# Patient Record
Sex: Male | Born: 1935 | Race: White | Hispanic: No | Smoking: Former smoker
Health system: Southern US, Community
[De-identification: ages and names within clinical notes are randomized; demographics above are authoritative.]

## PROBLEM LIST (undated history)

## (undated) DIAGNOSIS — H3530 Unspecified macular degeneration: Secondary | ICD-10-CM

## (undated) DIAGNOSIS — J189 Pneumonia, unspecified organism: Secondary | ICD-10-CM

## (undated) DIAGNOSIS — I499 Cardiac arrhythmia, unspecified: Secondary | ICD-10-CM

## (undated) DIAGNOSIS — I509 Heart failure, unspecified: Secondary | ICD-10-CM

## (undated) DIAGNOSIS — F329 Major depressive disorder, single episode, unspecified: Secondary | ICD-10-CM

## (undated) DIAGNOSIS — M25473 Effusion, unspecified ankle: Secondary | ICD-10-CM

## (undated) DIAGNOSIS — Z8739 Personal history of other diseases of the musculoskeletal system and connective tissue: Secondary | ICD-10-CM

## (undated) DIAGNOSIS — J449 Chronic obstructive pulmonary disease, unspecified: Secondary | ICD-10-CM

## (undated) DIAGNOSIS — F32A Depression, unspecified: Secondary | ICD-10-CM

## (undated) DIAGNOSIS — I5032 Chronic diastolic (congestive) heart failure: Secondary | ICD-10-CM

## (undated) DIAGNOSIS — I4891 Unspecified atrial fibrillation: Secondary | ICD-10-CM

## (undated) HISTORY — DX: Chronic diastolic (congestive) heart failure: I50.32

## (undated) HISTORY — DX: Personal history of other diseases of the musculoskeletal system and connective tissue: Z87.39

## (undated) HISTORY — DX: Chronic obstructive pulmonary disease, unspecified: J44.9

## (undated) HISTORY — DX: Unspecified macular degeneration: H35.30

---

## 2000-08-05 ENCOUNTER — Encounter: Payer: Self-pay | Admitting: Specialist

## 2000-08-05 ENCOUNTER — Encounter: Admission: RE | Admit: 2000-08-05 | Discharge: 2000-08-05 | Payer: Self-pay | Admitting: Specialist

## 2001-10-09 ENCOUNTER — Ambulatory Visit (HOSPITAL_COMMUNITY): Admission: RE | Admit: 2001-10-09 | Discharge: 2001-10-09 | Payer: Self-pay | Admitting: *Deleted

## 2007-08-12 ENCOUNTER — Ambulatory Visit (HOSPITAL_COMMUNITY): Admission: RE | Admit: 2007-08-12 | Discharge: 2007-08-12 | Payer: Self-pay | Admitting: Internal Medicine

## 2011-01-16 HISTORY — PX: DOPPLER ECHOCARDIOGRAPHY: SHX263

## 2011-02-02 NOTE — Procedures (Signed)
[facility]  Patient:    Jeremiah Mcmahon, Jeremiah Mcmahon Visit Number: [id number] MRN: [id number]          Service Type: END Location: ENDO Attending Physician:  Sabino Gasser Dictated by:   Sabino Gasser, M.D. Proc. Date: 10/09/01 Admit Date:  10/09/2001                             Procedure Report  PROCEDURE:  Colonoscopy.  INDICATION:  Colon cancer screening.  ANESTHESIA:  Demerol 50, Versed 3 mg.  DESCRIPTION OF PROCEDURE:  With the patient mildly sedated in the left lateral decubitus position, the Olympus videoscopic colonoscope was inserted into the rectum after a normal rectal exam and passed under direct vision to the cecum, identified by the ileocecal valve and appendiceal orifice, both of which were photographed.  From this point, the colonoscope was slowly withdrawn, taking circumferential views of the entire colonic mucosa, stopping only in the rectum which appeared normal on direct and showed hemorrhoids on retroflexed view.  The endoscope was straightened and withdrawn.  The patients vital signs and pulse oximeter remained stable.  The patient tolerated the procedure well without apparent complications.  FINDINGS:  Hemorrhoids, otherwise unremarkable examination.  PLAN:  Have patient follow up with me as needed. Dictated by:   Sabino Gasser, M.D. Attending Physician:  Sabino Gasser DD:  10/09/01 TD:  10/09/01 Job: [id number]

## 2012-07-03 ENCOUNTER — Encounter (HOSPITAL_BASED_OUTPATIENT_CLINIC_OR_DEPARTMENT_OTHER): Payer: Self-pay | Admitting: Emergency Medicine

## 2012-07-03 ENCOUNTER — Emergency Department (HOSPITAL_BASED_OUTPATIENT_CLINIC_OR_DEPARTMENT_OTHER): Payer: Medicare Other

## 2012-07-03 ENCOUNTER — Emergency Department (HOSPITAL_BASED_OUTPATIENT_CLINIC_OR_DEPARTMENT_OTHER)
Admission: EM | Admit: 2012-07-03 | Discharge: 2012-07-03 | Disposition: A | Discharge status: Home / Self Care | Payer: Medicare Other | Attending: Emergency Medicine | Admitting: Emergency Medicine

## 2012-07-03 DIAGNOSIS — Y998 Other external cause status: Secondary | ICD-10-CM | POA: Insufficient documentation

## 2012-07-03 DIAGNOSIS — Y9389 Activity, other specified: Secondary | ICD-10-CM | POA: Insufficient documentation

## 2012-07-03 DIAGNOSIS — S20219A Contusion of unspecified front wall of thorax, initial encounter: Secondary | ICD-10-CM | POA: Insufficient documentation

## 2012-07-03 DIAGNOSIS — Z87891 Personal history of nicotine dependence: Secondary | ICD-10-CM | POA: Insufficient documentation

## 2012-07-03 DIAGNOSIS — W19XXXA Unspecified fall, initial encounter: Secondary | ICD-10-CM | POA: Insufficient documentation

## 2012-07-03 DIAGNOSIS — I4891 Unspecified atrial fibrillation: Secondary | ICD-10-CM | POA: Insufficient documentation

## 2012-07-03 HISTORY — DX: Unspecified atrial fibrillation: I48.91

## 2012-07-03 HISTORY — DX: Effusion, unspecified ankle: M25.473

## 2012-07-03 MED ORDER — HYDROCODONE-ACETAMINOPHEN 5-500 MG PO TABS: 1.0000 | ORAL_TABLET | Freq: Four times a day (QID) | ORAL | Status: DC | PRN

## 2012-07-03 MED ORDER — HYDROCODONE-ACETAMINOPHEN 5-325 MG PO TABS
1.0000 | ORAL_TABLET | Freq: Once | ORAL | Status: AC
  Administered 2012-07-03: 1 via ORAL
  Filled 2012-07-03: qty 1

## 2012-07-03 NOTE — ED Notes (Signed)
Pt sts he fell in his room 2 nights ago.  Since then is having some pain in his right low rib area on his back.  Difficult to find a tender area but pain with certain movements.

## 2012-07-03 NOTE — ED Provider Notes (Signed)
History     CSN: [id number]  Arrival date & time 07/03/12  1551   First MD Initiated Contact with Patient 07/03/12 1755      Chief Complaint  Patient presents with  . Fall  . Back Pain    (Consider location/radiation/quality/duration/timing/severity/associated sxs/prior treatment) HPI Comments: Pt complains of right rib pain after a fall 2 days ago.  Fell backward when he was going to sit in a chair and missed chair, hit right mid back on chair.  Has had constant pain to right posterior rib.  Worse with movement, coughing, deep breathing.  No SOB.  No abd pain.   No dizziness.  No neck or back pain.  Did not hit his head when he fell.  No LOC.  The history is provided by the patient.    Past Medical History  Diagnosis Date  . Atrial fibrillation   . Swelling of ankle     History reviewed. No pertinent past surgical history.  No family history on file.  History  Substance Use Topics  . Smoking status: Former Games developer  . Smokeless tobacco: Not on file  . Alcohol Use: Yes      Review of Systems  Constitutional: Negative for fever, chills, diaphoresis and fatigue.  HENT: Negative for congestion, rhinorrhea, sneezing and neck pain.   Eyes: Negative.   Respiratory: Negative for cough, chest tightness and shortness of breath.   Cardiovascular: Positive for chest pain (rib pain). Negative for leg swelling.  Gastrointestinal: Negative for nausea, vomiting, abdominal pain, diarrhea and blood in stool.  Genitourinary: Negative for frequency, hematuria, flank pain and difficulty urinating.  Musculoskeletal: Negative for back pain and arthralgias.  Skin: Negative for rash.  Neurological: Negative for dizziness, speech difficulty, weakness, numbness and headaches.    Allergies  Review of patient's allergies indicates no known allergies.  Home Medications   Current Outpatient Rx  Name Route Sig Dispense Refill  . HYDROCODONE-ACETAMINOPHEN 5-500 MG PO TABS Oral Take 1-2  tablets by mouth every 6 (six) hours as needed for pain. 15 tablet 0    BP 138/84  Pulse 100  Temp 97.9 F (36.6 C) (Oral)  Resp 20  Ht 5\' 9"  (1.753 m)  Wt 182 lb (82.555 kg)  BMI 26.88 kg/m2  SpO2 100%  Physical Exam  Constitutional: He is oriented to person, place, and time. He appears well-developed and well-nourished.  HENT:  Head: Normocephalic and atraumatic.  Eyes: Pupils are equal, round, and reactive to light.  Neck: Normal range of motion. Neck supple.       No pain over neck or back  Cardiovascular: Normal rate, regular rhythm and normal heart sounds.   Pulmonary/Chest: Effort normal and breath sounds normal. No respiratory distress. He has no wheezes. He has no rales. He exhibits tenderness.       Ecchymosis to right mid back with TTP right lower rib. No crepitus or deformity  Abdominal: Soft. Bowel sounds are normal. There is no tenderness. There is no rebound and no guarding.       No pain around liver  Musculoskeletal: Normal range of motion. He exhibits no edema.       No pain with palpation or ROM of extremities  Lymphadenopathy:    He has no cervical adenopathy.  Neurological: He is alert and oriented to person, place, and time.  Skin: Skin is warm and dry. No rash noted.  Psychiatric: He has a normal mood and affect.    ED Course  Procedures (including  critical care time)  No results found for this or any previous visit. Dg Chest 2 View  07/03/2012  *RADIOLOGY REPORT*  Clinical Data: Right-sided chest pain.  CHEST - 2 VIEW  Comparison: None  Findings: The heart is mildly enlarged.  The mediastinal and hilar contours are normal.  The lungs are clear of acute process.  A small calcified granuloma is noted in the left lower lobe.  No pneumothorax or pleural effusion.  The bony thorax is intact.  No definite acute rib fractures.  IMPRESSION: No acute cardiopulmonary findings and intact bony thorax.   Original Report Authenticated By: P. Loralie Champagne, M.D.       1. Rib contusion       MDM  Pt well appearing, no SOB, sats normal.  No abd pain.  No evidence of head injury.  Will tx with vicodin, given incentive spirometer, will f/u with PMD if not improving.        Rolan Bucco, MD 07/03/12 [phone]

## 2012-07-03 NOTE — Patient Instructions (Signed)
Pt instructed on the proper use of administering IS x 10 Q/hr. Pt achieved without difficulty and no pain. Pt tolerated well

## 2013-03-25 ENCOUNTER — Ambulatory Visit (INDEPENDENT_AMBULATORY_CARE_PROVIDER_SITE_OTHER): Payer: Medicare Other | Admitting: Cardiology

## 2013-03-25 ENCOUNTER — Encounter: Payer: Self-pay | Admitting: Cardiology

## 2013-03-25 VITALS — BP 118/82 | HR 85 | Ht 70.0 in | Wt 194.7 lb

## 2013-03-25 DIAGNOSIS — I4821 Permanent atrial fibrillation: Secondary | ICD-10-CM | POA: Insufficient documentation

## 2013-03-25 DIAGNOSIS — M25473 Effusion, unspecified ankle: Secondary | ICD-10-CM

## 2013-03-25 DIAGNOSIS — R600 Localized edema: Secondary | ICD-10-CM | POA: Insufficient documentation

## 2013-03-25 DIAGNOSIS — I482 Chronic atrial fibrillation, unspecified: Secondary | ICD-10-CM

## 2013-03-25 DIAGNOSIS — I4891 Unspecified atrial fibrillation: Secondary | ICD-10-CM

## 2013-03-25 NOTE — Progress Notes (Signed)
Patient ID: Jeremiah Mcmahon, male   DOB: 10-25-1913, 79 y.o.   MRN: [id number]  Clinic Note: HPI: Jeremiah Mcmahon is a 79 y.o. male with a essentially rate controlled and asymptomatic atrial fibrillation who presents today for routine annual followup.  Interval History: Elderly these do quite well. His reactive for his age. He denies any sensation of palpitations or rapid heartbeats. He denies any chest tightness or pressure with exertion. No PND, orthopnea, mild as mild lower extremity edema. He says he takes his furosemide on an as-needed basis but otherwise the swelling is stable. His worse on the foot this foot drop. He still goes to the gym and is living and in this swimming. He says of late he's had some increased bowel movements but denies any melena, hematochezia hematuria. No other GI symptoms. No lightheadedness, dizziness, wooziness or syncope/near syncope. No TIA or RCA symptoms. No melena, hematochezia, or hematuria. No claudication symptoms.  Past Medical History  Diagnosis Date  . Atrial fibrillation     rate controlled; asymptomatic  . Swelling of ankle   . Macular degeneration   . H/O spinal stenosis     R foot drop  . COPD, mild     Prior Cardiac Evaluation and Past Surgical History: Past Surgical History  Procedure Laterality Date  . Doppler echocardiography  May 2012    Normal EF greater than 55%. Moderate left and right atria dilation; Mild MR; mild aortic sclerosis without stenosis     No Known Allergies  Current Outpatient Prescriptions  Medication Sig Dispense Refill  . Ascorbic Acid (VITAMIN C) 1000 MG tablet Take 1,000 mg by mouth daily.      Marland Kitchen aspirin 325 MG EC tablet Takes a 1/4 a tablet by mouth daily      . clopidogrel (PLAVIX) 75 MG tablet Take 75 mg by mouth daily.      . Cyanocobalamin (VITAMIN B 12 PO) Take 1,000 mcg by mouth.      . diltiazem (CARDIZEM) 120 MG tablet Take 120 mg by mouth 2 (two) times daily.      . fish oil-omega-3 fatty acids 1000  MG capsule Take 1 g by mouth daily.      . furosemide (LASIX) 40 MG tablet Take 40 mg by mouth daily. In the morning      . Glucosamine 500 MG CAPS Take 1,000 mg by mouth.      Marland Kitchen HYDROcodone-acetaminophen (VICODIN) 5-500 MG per tablet Take 1-2 tablets by mouth every 6 (six) hours as needed for pain.  15 tablet  0  . Multiple Vitamins-Minerals (MULTI COMPLETE PO) Take by mouth.      . potassium chloride (KLOR-CON) 8 MEQ tablet Take 8 mEq by mouth daily.       No current facility-administered medications for this visit.    Social History Narrative   Lives in an [facility] retirement community.  He is a widowed father of 4, grandfather of 1. He works out at [employer] 60 minutes at a time 2 to 3 times a week doing arm and leg exercises. He does not walk well because of his foot drop. He does not smoke and has an occasional alcoholic beverage.  He no longer drives.   He is looking into getting into swimming this fall.    ROS: A comprehensive Review of Systems - Negative except pertinent positives above Gastrointestinal ROS: positive for - change in bowel habits, change in stools and Increase, loose stool, but not incontinent or diarrhea.  PHYSICAL EXAM BP 118/82  Pulse 85  Ht 5\' 10"  (1.778 m)  Wt 194 lb 11.2 oz (88.315 kg)  BMI 27.94 kg/m2 General appearance: alert, cooperative, appears stated age, no distress and Otherwise healthy-appearing. Normal mood and affect. Well-nourished well-groomed. Answers questions appropriately. Neck: no adenopathy, no carotid bruit, no JVD, supple, symmetrical, trachea midline and thyroid not enlarged, symmetric, no tenderness/mass/nodules Lungs: clear to auscultation bilaterally, normal percussion bilaterally and Nonlabored, good air movement. Heart: regular rate and rhythm, S1, S2 normal, no S3 or S4, systolic murmur: systolic ejection 1/6, crescendo, decrescendo and Very soft at 2nd right intercostal space, no click and no rub Abdomen: soft,  non-tender; bowel sounds normal; no masses,  no organomegaly Extremities: extremities normal, atraumatic, no cyanosis or edema and no ulcers, gangrene or trophic changes Pulses: 2+ and symmetric Neurologic: Grossly normal  [id number] today: Yes Rate: 85 , Rhythm: Atrial fibrillation; bifascicular block (R BBB, LAFB) Recent Labs:  ASSESSMENT: Stable elderly gentleman with no symptoms of his atrial fibrillation. No angina or other cardiac symptoms to speak of.  I've instructed him to look out for the following:  Keep up the Exercise & Let us Know If Anything Changes:  Bleeding (in Stool, Urine, or Anywhere)  Dizzines, Lightheadedness or Passing out Symptoms  Shortness of Breath on Exertion, or Lying down.   Chronic atrial fibrillation Rates well controlled on calcitriol blocker and otherwise astigmatic. He is on aspirin Plavix for stroke prevention. Not sure how much benefit he is driving from that. If there was any question or concern about any bleeding or bruising I would simply to stop the Plavix. A  Swelling of ankle This is pretty stable, worse on the right than left with foot drop. As the bothering him he is using Lasix, when necessary basis if worse.   PLAN: Per problem list. Orders Placed This Encounter  Procedures  . EKG 12-Lead    Followup: 1 yr.  DAVID W. Herbie Baltimore, M.D., M.S. [facility] [address] [place], [place]  [address]  [phone] Pager # [phone]

## 2013-03-25 NOTE — Patient Instructions (Addendum)
You continue to amaze me how well you are doing. The atrial fibrillation does not seem to be bothering you.  I do not plan to make any changes to your medicines.    Keep up the Exercise & Let us Know If Anything Changes:  Bleeding (in Stool, Urine, or Anywhere)  Dizzines, Lightheadedness or Passing out Symptoms  Shortness of Breath on Exertion, or Lying down.  Marykay Lex, MD

## 2013-04-03 ENCOUNTER — Encounter: Payer: Self-pay | Admitting: Cardiology

## 2013-04-03 NOTE — Assessment & Plan Note (Signed)
Rates well controlled on calcitriol blocker and otherwise astigmatic. He is on aspirin Plavix for stroke prevention. Not sure how much benefit he is driving from that. If there was any question or concern about any bleeding or bruising I would simply to stop the Plavix. A

## 2013-04-03 NOTE — Assessment & Plan Note (Signed)
This is pretty stable, worse on the right than left with foot drop. As the bothering him he is using Lasix, when necessary basis if worse.

## 2013-05-06 ENCOUNTER — Encounter: Payer: Self-pay | Admitting: Cardiology

## 2013-05-15 ENCOUNTER — Other Ambulatory Visit: Payer: Self-pay | Admitting: Cardiology

## 2013-05-15 NOTE — Telephone Encounter (Signed)
Rx was sent to pharmacy electronically. 

## 2013-07-13 ENCOUNTER — Other Ambulatory Visit: Payer: Self-pay | Admitting: *Deleted

## 2013-07-13 MED ORDER — DILTIAZEM HCL 120 MG PO TABS: 120.0000 mg | ORAL_TABLET | Freq: Two times a day (BID) | ORAL | Status: DC

## 2013-07-13 NOTE — Telephone Encounter (Signed)
Rx was sent to pharmacy electronically. 

## 2013-09-30 ENCOUNTER — Emergency Department (HOSPITAL_BASED_OUTPATIENT_CLINIC_OR_DEPARTMENT_OTHER)
Admission: EM | Admit: 2013-09-30 | Discharge: 2013-09-30 | Disposition: A | Discharge status: Home / Self Care | Payer: Medicare Other | Attending: Emergency Medicine | Admitting: Emergency Medicine

## 2013-09-30 ENCOUNTER — Encounter (HOSPITAL_BASED_OUTPATIENT_CLINIC_OR_DEPARTMENT_OTHER): Payer: Self-pay | Admitting: Emergency Medicine

## 2013-09-30 DIAGNOSIS — R040 Epistaxis: Secondary | ICD-10-CM | POA: Insufficient documentation

## 2013-09-30 DIAGNOSIS — Z7982 Long term (current) use of aspirin: Secondary | ICD-10-CM | POA: Insufficient documentation

## 2013-09-30 DIAGNOSIS — I4891 Unspecified atrial fibrillation: Secondary | ICD-10-CM | POA: Insufficient documentation

## 2013-09-30 DIAGNOSIS — Z87891 Personal history of nicotine dependence: Secondary | ICD-10-CM | POA: Insufficient documentation

## 2013-09-30 DIAGNOSIS — J449 Chronic obstructive pulmonary disease, unspecified: Secondary | ICD-10-CM | POA: Insufficient documentation

## 2013-09-30 DIAGNOSIS — Z79899 Other long term (current) drug therapy: Secondary | ICD-10-CM | POA: Insufficient documentation

## 2013-09-30 DIAGNOSIS — Z7902 Long term (current) use of antithrombotics/antiplatelets: Secondary | ICD-10-CM | POA: Insufficient documentation

## 2013-09-30 DIAGNOSIS — Z8669 Personal history of other diseases of the nervous system and sense organs: Secondary | ICD-10-CM | POA: Insufficient documentation

## 2013-09-30 DIAGNOSIS — Z8739 Personal history of other diseases of the musculoskeletal system and connective tissue: Secondary | ICD-10-CM | POA: Insufficient documentation

## 2013-09-30 DIAGNOSIS — J4489 Other specified chronic obstructive pulmonary disease: Secondary | ICD-10-CM | POA: Insufficient documentation

## 2013-09-30 MED ORDER — SILVER NITRATE-POT NITRATE 75-25 % EX MISC
CUTANEOUS | Status: AC
  Administered 2013-09-30: 12:00:00
  Filled 2013-09-30: qty 1

## 2013-09-30 MED ORDER — OXYMETAZOLINE HCL 0.05 % NA SOLN
NASAL | Status: AC
  Administered 2013-09-30: 12:00:00
  Filled 2013-09-30: qty 15

## 2013-09-30 NOTE — Discharge Instructions (Signed)

## 2013-09-30 NOTE — ED Notes (Signed)
MD at bedside. 

## 2013-09-30 NOTE — ED Notes (Signed)
No bleeding noted from left nare.

## 2013-09-30 NOTE — ED Notes (Signed)
ENT cart at bedside

## 2013-09-30 NOTE — ED Provider Notes (Signed)
CSN: [id number]     Arrival date & time 09/30/13  1127 History   First MD Initiated Contact with Patient 09/30/13 1138     Chief Complaint  Patient presents with  . Epistaxis   (Consider location/radiation/quality/duration/timing/severity/associated sxs/prior Treatment) Patient is a 80 y.o. male presenting with nosebleeds. The history is provided by the patient.  Epistaxis Location:  L nare Severity:  Moderate Timing:  Intermittent Progression:  Worsening Chronicity:  New Context: aspirin use and weather change (cold, heat on)   Context: not bleeding disorder, not CPAP, not drug use and not elevation change   Relieved by:  Nothing Worsened by:  Nothing tried Ineffective treatments:  Nasal tampon Associated symptoms: no blood in oropharynx, no congestion, no cough, no dizziness, no fever, no sore throat and no syncope     Past Medical History  Diagnosis Date  . Atrial fibrillation     rate controlled; asymptomatic  . Swelling of ankle   . Macular degeneration   . H/O spinal stenosis     R foot drop  . COPD, mild    Past Surgical History  Procedure Laterality Date  . Doppler echocardiography  May 2012    Normal EF greater than 55%. Moderate left and right atria dilation; Mild MR; mild aortic sclerosis without stenosis    Family History  Problem Relation Age of Onset  . Healthy      Non-contributory due to age   History  Substance Use Topics  . Smoking status: Former Smoker    Types: Pipe  . Smokeless tobacco: Not on file  . Alcohol Use: 0.6 oz/week    1 Glasses of wine per week    Review of Systems  Constitutional: Negative for fever.  HENT: Positive for nosebleeds. Negative for congestion and sore throat.   Respiratory: Negative for cough.   Cardiovascular: Negative for syncope.  Neurological: Negative for dizziness.  All other systems reviewed and are negative.    Allergies  Review of patient's allergies indicates no known allergies.  Home Medications    Current Outpatient Rx  Name  Route  Sig  Dispense  Refill  . Ascorbic Acid (VITAMIN C) 1000 MG tablet   Oral   Take 1,000 mg by mouth daily.         Marland Kitchen. aspirin 325 MG EC tablet      Takes a 1/4 a tablet by mouth daily         . clopidogrel (PLAVIX) 75 MG tablet      TAKE ONE TABLET BY MOUTH EVERY DAY   30 tablet   11   . Cyanocobalamin (VITAMIN B 12 PO)   Oral   Take 1,000 mcg by mouth.         . diltiazem (CARDIZEM) 120 MG tablet   Oral   Take 1 tablet (120 mg total) by mouth 2 (two) times daily.   60 tablet   9   . fish oil-omega-3 fatty acids 1000 MG capsule   Oral   Take 1 g by mouth daily.         . furosemide (LASIX) 40 MG tablet   Oral   Take 40 mg by mouth daily. In the morning         . Glucosamine 500 MG CAPS   Oral   Take 1,000 mg by mouth.         Marland Kitchen. HYDROcodone-acetaminophen (VICODIN) 5-500 MG per tablet   Oral   Take 1-2 tablets by mouth every 6 (  six) hours as needed for pain.   15 tablet   0   . Multiple Vitamins-Minerals (MULTI COMPLETE PO)   Oral   Take by mouth.         . potassium chloride (KLOR-CON) 8 MEQ tablet   Oral   Take 8 mEq by mouth daily.          BP 129/89  Pulse 85  Temp(Src) 97.9 F (36.6 C) (Oral)  Resp 16  Ht 5\' 10"  (1.778 m)  Wt 190 lb (86.183 kg)  BMI 27.26 kg/m2  SpO2 94% Physical Exam  Nursing note and vitals reviewed. Constitutional: He is oriented to person, place, and time. He appears well-developed and well-nourished. No distress.  HENT:  Head: Normocephalic and atraumatic.  Nose: Epistaxis (L nare, anterior) is observed.  No foreign bodies.  Mouth/Throat: No oropharyngeal exudate.  Eyes: EOM are normal. Pupils are equal, round, and reactive to light.  Neck: Normal range of motion. Neck supple.  Cardiovascular: Normal rate and regular rhythm.  Exam reveals no friction rub.   No murmur heard. Pulmonary/Chest: Effort normal and breath sounds normal. No respiratory distress. He has no  wheezes. He has no rales.  Abdominal: He exhibits no distension. There is no tenderness. There is no rebound.  Musculoskeletal: Normal range of motion. He exhibits no edema.  Neurological: He is alert and oriented to person, place, and time.  Skin: No rash noted. He is not diaphoretic.    ED Course  EPISTAXIS MANAGEMENT Date/Time: 09/30/2013 11:59 AM Performed by: Dagmar Hait Authorized by: Dagmar Hait Consent: Verbal consent obtained. Patient sedated: no Treatment site: left anterior Repair method: silver nitrate Treatment complexity: simple   (including critical care time) Labs Review Labs Reviewed - No data to display Imaging Review No results found.  EKG Interpretation   None       MDM   1. Anterior epistaxis    57M presents with epistaxis. Seen at his PCP's office, sent here for further eval. Anterior pack placed by PCP using glove and cotton balls. On plavix and aspirin. No hx of epistaxis. Had this happen last night, self-limited. Began again this am. Here AFVSS. L nare packing removed. After Afrin administration, anterior bleeding noted. Silver nitrate stick used for cauterization. Will recheck. Second cauterization performed after recheck as he was still bleeding. Patient stable after that without bleeding for 45 minutes. Stable for discharge.    Dagmar Hait, MD 09/30/13 [phone]

## 2013-09-30 NOTE — ED Notes (Signed)
Nosebleed that started at 0830 this am.  He reports nosebleed last pm but it stopped.

## 2013-10-20 ENCOUNTER — Ambulatory Visit (INDEPENDENT_AMBULATORY_CARE_PROVIDER_SITE_OTHER): Payer: Medicare Other | Admitting: Cardiology

## 2013-10-20 ENCOUNTER — Encounter: Payer: Self-pay | Admitting: Cardiology

## 2013-10-20 VITALS — BP 124/96 | HR 96 | Ht 68.0 in | Wt 192.1 lb

## 2013-10-20 DIAGNOSIS — R609 Edema, unspecified: Secondary | ICD-10-CM

## 2013-10-20 DIAGNOSIS — R0989 Other specified symptoms and signs involving the circulatory and respiratory systems: Secondary | ICD-10-CM

## 2013-10-20 DIAGNOSIS — E039 Hypothyroidism, unspecified: Secondary | ICD-10-CM

## 2013-10-20 DIAGNOSIS — R0600 Dyspnea, unspecified: Secondary | ICD-10-CM | POA: Insufficient documentation

## 2013-10-20 DIAGNOSIS — I4891 Unspecified atrial fibrillation: Secondary | ICD-10-CM

## 2013-10-20 DIAGNOSIS — J449 Chronic obstructive pulmonary disease, unspecified: Secondary | ICD-10-CM

## 2013-10-20 DIAGNOSIS — R0609 Other forms of dyspnea: Secondary | ICD-10-CM

## 2013-10-20 DIAGNOSIS — Z8709 Personal history of other diseases of the respiratory system: Secondary | ICD-10-CM

## 2013-10-20 DIAGNOSIS — I4821 Permanent atrial fibrillation: Secondary | ICD-10-CM

## 2013-10-20 DIAGNOSIS — R600 Localized edema: Secondary | ICD-10-CM

## 2013-10-20 DIAGNOSIS — I452 Bifascicular block: Secondary | ICD-10-CM

## 2013-10-20 DIAGNOSIS — Z87898 Personal history of other specified conditions: Secondary | ICD-10-CM

## 2013-10-20 NOTE — Assessment & Plan Note (Signed)
"  Mild" by previous PFTs

## 2013-10-20 NOTE — Patient Instructions (Addendum)
Keep your follow up with Dr Renne CriglerPharr, go to the ER if your symptoms worsen.  Your physician recommends that you schedule a follow-up appointment in: 3 months with Dr Herbie BaltimoreHarding

## 2013-10-20 NOTE — Assessment & Plan Note (Signed)
Pt referred for gradually worsening dyspnea on exertion

## 2013-10-20 NOTE — Progress Notes (Addendum)
10/20/2013 Jeremiah ObeyElmer Mcmahon   12-15-1913  [id number]  Primary Physicia Londell MohPHARR,WALTER DAVIDSON, MD Primary Cardiologist: Dr Herbie BaltimoreHarding  HPI:  80 y/o male followed by Dr Herbie BaltimoreHarding and Dr Renne CriglerPharr. He lives at an assisted living facility. He is sharp mentally and has tried to stay active. He has CAF and is on ASA and Plavix. An echo in May 2012 showed mild LVH and an EF of 55%. He has chronic lower extremity edema and has had some degree of dyspnea for some time. His daughter thinks he had "mild COPD" on a prior PFT. He is in the office today with complaints of gradually increasing dyspnea with exertion. He denies orthopnea, he actually says he feels better when he lays flat. His main complain is SOB when walking. He says he can now hardly get down the hallway at his assited living facility.  Dr Renne CriglerPharr had seen him two weeks ago. The pt says his LE edema is a little better but his DOE has not changed.    Current Outpatient Prescriptions  Medication Sig Dispense Refill  . Ascorbic Acid (VITAMIN C) 1000 MG tablet Take 1,000 mg by mouth daily.      Marland Kitchen. aspirin 325 MG EC tablet Takes a 1/4 a tablet by mouth daily      . clopidogrel (PLAVIX) 75 MG tablet TAKE ONE TABLET BY MOUTH EVERY DAY  30 tablet  11  . Cyanocobalamin (VITAMIN B 12 PO) Take 1,000 mcg by mouth.      . diltiazem (CARDIZEM) 120 MG tablet Take 1 tablet (120 mg total) by mouth 2 (two) times daily.  60 tablet  9  . fish oil-omega-3 fatty acids 1000 MG capsule Take 1 g by mouth daily.      . furosemide (LASIX) 40 MG tablet Take 40 mg by mouth 2 (two) times daily. In the morning      . Glucosamine 500 MG CAPS Take 1,000 mg by mouth.      . levothyroxine (SYNTHROID, LEVOTHROID) 50 MCG tablet Take 50 mcg by mouth daily before breakfast.      . Multiple Vitamins-Minerals (MULTI COMPLETE PO) Take by mouth.      . potassium chloride (KLOR-CON) 8 MEQ tablet Take 8 mEq by mouth daily.       No current facility-administered medications for this visit.    No  Known Allergies  History   Social History  . Marital Status: Divorced    Spouse Name: N/A    Number of Children: N/A  . Years of Education: N/A   Occupational History  . Not on file.   Social History Main Topics  . Smoking status: Former Smoker    Types: Pipe  . Smokeless tobacco: Not on file  . Alcohol Use: 0.6 oz/week    1 Glasses of wine per week  . Drug Use: No  . Sexual Activity: Not on file   Other Topics Concern  . Not on file   Social History Narrative   Lives in an Independent Living retirement community.  He is a widowed father of 4, grandfather of 1. He works out at [employer] 60 minutes at a time 2 to 3 times a week doing arm and leg exercises. He does not walk well because of his foot drop. He does not smoke and has an occasional alcoholic beverage.  He no longer drives.   He is looking into getting into swimming this fall.     Review of Systems: General: negative for chills, fever, night  sweats or weight changes.  Cardiovascular: negative for chest pain, orthopnea, palpitations, paroxysmal nocturnal dyspnea Dermatological: negative for rash Respiratory: negative for cough or wheezing Urologic: negative for hematuria Abdominal: negative for nausea, vomiting, diarrhea, bright red blood per rectum, melena, or hematemesis Neurologic: negative for visual changes, syncope, or dizziness All other systems reviewed and are otherwise negative except as noted above.    Blood pressure 124/96, pulse 96, height 5\' 8"  (1.727 m), weight 192 lb 1.6 oz (87.136 kg).  General appearance: alert, cooperative and no distress Neck: no carotid bruit and no JVD Lungs: expiratory wheezing bilat Heart: regular rate and rhythm Abdomen: soft non tender Extremities: chronic venous changes, chronic 1-2+ edema Skin: cool and dry Neurologic: Grossly normal  EKG AF, RBBB, LAFB  ASSESSMENT AND PLAN:   Dyspnea Pt referred for gradually worsening dyspnea on exertion  Permanent atrial  fibrillation .  RBBB with left anterior fascicular block .  Edema of both legs .  H/O epistaxis One episode requiring an ER visit, I suggested he continue Plavix.  COPD (chronic obstructive pulmonary disease) "Mild" by previous PFTs   PLAN  His O2 sat on RA is 93%. He has chronic LE edema that is actually better than usual. His dyspnea is exertional. He has no orthopnea. I'm not convinced this is CHF. It may be primarily pulmonary. I suggested to the pt and his daughter that he go to the ER for lab work, CXR, and treatment based on those results. They declined and will follow up with Dr Renne Crigler this afternoon. He knows to go to the hospital if his symptoms change for the worse.  Corine Shelter KPA-C 10/20/2013 1:51 PM   Jeremiah Mcmahon asked about Plavix. He apparently had to go to the ER for a nosebleed in Jan. Since then he has used a humidifier and Vaseline and has not had recurrent epistaxis. I suggested he continue Plavix and low dose aspirin for now with CAF.

## 2013-10-20 NOTE — Assessment & Plan Note (Signed)
One episode requiring an ER visit, I suggested he continue Plavix.

## 2014-01-29 DIAGNOSIS — I5032 Chronic diastolic (congestive) heart failure: Secondary | ICD-10-CM | POA: Insufficient documentation

## 2014-02-03 ENCOUNTER — Ambulatory Visit: Payer: Medicare Other | Admitting: Cardiology

## 2014-03-10 ENCOUNTER — Ambulatory Visit: Payer: Medicare Other | Admitting: Cardiology

## 2014-03-22 ENCOUNTER — Telehealth: Payer: Self-pay | Admitting: Cardiology

## 2014-03-22 NOTE — Telephone Encounter (Signed)
Wants to talk to a nurse regarding his appointment tomorrow with laura.  Wants to know if he should wait til next week to see Dr Herbie BaltimoreHarding instead of coming both weeks.  Please call

## 2014-03-22 NOTE — Telephone Encounter (Signed)
Daughter wants to know does he really need to see the PA tomorrow and the doctor next week?  Really just wants to see the doctor.  Was seen by Dr. Lowell GuitarPowell last week for increasing SOB at the rehab facility where he is staying.  CXR showed increased fluid build up in his lungs and was started on Torsemide. Per Dr. Lowell GuitarPowell -  If this does not work will need to draw the fluid off.  Told daughter it is entirely up to she and her father whether they see Nada BoozerLaura Ingold, NP tomorrow, depending on his symptoms, or wait until next week and see Dr. Herbie BaltimoreHarding.  I did suggest they call Dr. Lowell GuitarPowell for follow-up.  Voiced understanding and will talk with her father and let us know in AM if he is coming tomorrow.

## 2014-03-23 ENCOUNTER — Ambulatory Visit: Payer: Medicare Other | Admitting: Cardiology

## 2014-03-29 ENCOUNTER — Encounter: Payer: Self-pay | Admitting: Cardiology

## 2014-03-29 ENCOUNTER — Ambulatory Visit (INDEPENDENT_AMBULATORY_CARE_PROVIDER_SITE_OTHER): Payer: Medicare Other | Admitting: Cardiology

## 2014-03-29 VITALS — BP 100/70 | HR 104 | Wt 203.0 lb

## 2014-03-29 DIAGNOSIS — Z87898 Personal history of other specified conditions: Secondary | ICD-10-CM

## 2014-03-29 DIAGNOSIS — T50904A Poisoning by unspecified drugs, medicaments and biological substances, undetermined, initial encounter: Secondary | ICD-10-CM

## 2014-03-29 DIAGNOSIS — I9589 Other hypotension: Secondary | ICD-10-CM

## 2014-03-29 DIAGNOSIS — R0609 Other forms of dyspnea: Secondary | ICD-10-CM

## 2014-03-29 DIAGNOSIS — I4891 Unspecified atrial fibrillation: Secondary | ICD-10-CM

## 2014-03-29 DIAGNOSIS — R609 Edema, unspecified: Secondary | ICD-10-CM

## 2014-03-29 DIAGNOSIS — I509 Heart failure, unspecified: Secondary | ICD-10-CM

## 2014-03-29 DIAGNOSIS — R0600 Dyspnea, unspecified: Secondary | ICD-10-CM

## 2014-03-29 DIAGNOSIS — Z8709 Personal history of other diseases of the respiratory system: Secondary | ICD-10-CM

## 2014-03-29 DIAGNOSIS — R600 Localized edema: Secondary | ICD-10-CM

## 2014-03-29 DIAGNOSIS — R0989 Other specified symptoms and signs involving the circulatory and respiratory systems: Secondary | ICD-10-CM

## 2014-03-29 DIAGNOSIS — I4821 Permanent atrial fibrillation: Secondary | ICD-10-CM

## 2014-03-29 DIAGNOSIS — I952 Hypotension due to drugs: Secondary | ICD-10-CM

## 2014-03-29 MED ORDER — DILTIAZEM HCL 60 MG PO TABS: 60.0000 mg | ORAL_TABLET | Freq: Three times a day (TID) | ORAL | Status: DC

## 2014-03-29 MED ORDER — TORSEMIDE 20 MG PO TABS: ORAL_TABLET | ORAL | Status: DC

## 2014-03-29 NOTE — Patient Instructions (Addendum)
Torsemide 40 mg twice a day for 2 days then  40 mg in morning and 20 mg in evening for 2 days then For the next 6 days alternate  40 mg in am and 20 mg in evening every other( even ) day then 20 mg twice a day  Every other (odd) day. Then 20 mg twice a a day.  DECREASE CARVEDIOL  T0 3.125 MG  TWICE A DAY FOR 2 DAYS THEN STOP- WITH  DECREASE START DILTIAZEM 60 MG TWICE A DAY   FOR 2 DAYS  AFTER DILTIAZEM 60 MG THREE TIMES  DAY.     Your physician wants you to follow-up in  SEPT 10, 2015 AT 11:30 AM DR Herbie BaltimoreHarding. 30 min appt  You will receive a reminder letter in the mail two months in advance. If you don't receive a letter, please call our office to schedule the follow-up appointment.

## 2014-03-31 DIAGNOSIS — I952 Hypotension due to drugs: Secondary | ICD-10-CM | POA: Insufficient documentation

## 2014-03-31 NOTE — Assessment & Plan Note (Addendum)
Again, without knowledge of the EF, I don't know that I'm convinced that his symptoms are totally left-sided CHF.  He does have an elevated BNP, but I don't know what her baseline was. However he does have significant dyspnea with oxygen requirement which would suggest if nothing else elevated pulmonary pressures related to lower extremity edema. He really does not have much in the way of orthopnea or PND to suggest left heart failure. When we do know is that he is extremely volume overloaded with anasarca, and his weight is essentially 10 pounds from his previous dry weight of 192 pounds in February. (194 in July of last year)  He doesn't have a lot of blood pressure will, and I think with his hypotension, carvedilol was not a good option for him. It is not control his heart rate which is not helping his effective cardiac output. My plan is to convert from carvedilol back to diltiazem.  We'll need to get his records from [facility] in order to fully know what to do. I am also not privy to recent chest x-ray findings.  The only way to get rid of fluid is to diuresis. He is on torsemide at 20 mg twice a day. Plan: Increase torsemide to 40 mg twice a day for 2 days, then 40 mg a.m., 20 mg p.m. for 2 days and then alternate every other day doing 20 twice a day one day and 40 mg in a.m. and 20 in p.m. the next day. He will continue this for one week and then return back to 20 twice a day. He will need labs rechecked which will probably be done by his PCP or nephrologist. We will probably need to except some baseline renal insufficiency.  I am also concerned with his hypotension and not convinced that carvedilol is the best treatment for him. Converting back to diltiazem unless there is some suggestion of decreased EF.   I do know is to be seeing his nephrologist next month so I will see him back in 2 months.

## 2014-03-31 NOTE — Assessment & Plan Note (Signed)
His blood pressure this morning was way too low, it is still low in the clinic. Carvedilol may be good for her heart failure, but I would not continue with his responsiveness to the alpha component.  If we need to use a beta blocker in the setting of reduced EF, recommend metoprolol succinate or bisoprolol, but for now unless I know for sure his EF is reduced I will use diltiazem which has been better for rate control and less hypotensive inducing

## 2014-03-31 NOTE — Assessment & Plan Note (Signed)
He does have baseline edema and has orders for her leg wraps. He did not have baseline anasarca, therefore I think we need to diurese off at least 10 more pounds. Therefore not able to do this with by mouth medications it may require short rehospitalization for IV diuresis.

## 2014-03-31 NOTE — Progress Notes (Signed)
Patient ID: Jeremiah Mcmahon, male   DOB: 1914/06/28, 80 y.o.   MRN: [id number] PCP:  Jeremiah Mcmahon  Clinic Note:  Chief Complaint:  Chief Complaint  Patient presents with  . Follow-up    POST HOSP FOR 13 DAYS - CHF, UTI,- NO CHEST PAIN , O2 THERAPY CONTINOUS MOST  OF THE TIME, EDEMA -fluid restrictions    HPI: Jeremiah Mcmahon is a 80 y.o. male with a essentially rate controlled and previously asymptomatic atrial fibrillation, and chronic lower extremity edema with no other signs and symptoms of heart failure in the past who presents today post-hospitalization followup. He did see Jeremiah Mcmahon, [place] and February for progressively worsening exertional dyspnea, but improved edema. He was noted to have an oxygen saturation level of 92% on room air. Justifiably, Jeremiah Mcmahon was not convinced that this was CHF, but recommended that the patient go to the ER for more detailed assessment with labs and chest x-ray. They declined and went to see Dr. Renne Mcmahon.  Apparently, he was just hospitalized at [facility] for her what appear to be anasarca, acute kidney injury with symptoms concerning for CHF. I do not have the discharge summary or the echocardiogram findings.  Care Everywhere Does not have results from his hospitalization. I do know that he was switched from diltiazem to carvedilol, and his nephrologist has reduced the dose of carvedilol to 6.25 mg twice a day from 12.5 mg twice a day. He is on furosemide plus Aldactone despite renal insufficiency.  He apparently was diuresis to 20 pounds, but remains over 10 pounds above his dry weight from last year. He is currently at [facility] in the rehabilitation center. He is on home oxygen now at 2 L per nasal cannula.  He was admitted on June 14 with worsening shortness of breath and increasing swelling of the lower extremities. He was found to be in A. fib with RVR. His very attentive daughter Jeremiah Mcmahon(Jeremiah Mcmahon) actually went to see him at  the assisted living center and was concerned about his dyspnea and worsening edema. He at no time indicated any sensation of chest discomfort or sensation of rapid heartbeat. No fevers or chills to suggest any active infection. The only symptom is worse the worsening exertional dyspnea with PND and orthopnea. From what I can tell, his initial care included placing him on a diltiazem drip as well as Lopressor which was eventually changed to carvedilol because of possible heart failure. He had a chest x-ray which showed pleural effusion.  Interval History:  Since his discharge, he has seen his nephrologist who noted hypotension and dizziness. His beta blocker dose was reduced. He was put on a low-salt diet and given a fluid restriction of 1500 mL. A BNP level was checked on July 6 which showed a number of 508. I don't know what the hospital reading was. His creatinine was 1.81 (up from 1.65 on June 29) with a BUN of 37 (this was suggest prerenal). Overall, his edema is better but he still has swelling up into his thighs. His baseline edema is pretty much 2+ to his knees. He doesn't notice much of the way of any PND orthopnea, but still has a hard time getting around because his edema. He is on oxygen still and notes exertional dyspnea. He does not sense any of his rapid A. fib. He did note feeling lightheaded and dizzy this morning and has felt that way several mornings but has not had any falls.  No chest tightness or pressure with rest or exertion. No syncope/near syncope or TIA/CVA symptoms. Just overall general fatigue with prolonged episodes of sleeping. No melena, hematochezia, hematuria or epistaxis. No claudication, but does have a sense of heavy dull discomfort in his legs. He is currently trying to rehabilitation but is nowhere near back to the state that he was last year when he was going to the gym and doing exercises and swimming.  Past Medical History  Diagnosis Date  . Atrial fibrillation       rate controlled; asymptomatic  . Swelling of ankle   . Macular degeneration   . H/O spinal stenosis     R foot drop  . COPD, mild     recent admission for "heart failure "  Acute on chronic renal insufficiency - current creatinine 1.8  Prior Cardiac Evaluation and Past Surgical History: Past Surgical History  Procedure Laterality Date  . Doppler echocardiography  May 2012    Normal EF greater than 55%. Moderate left and right atria dilation; Mild MR; mild aortic sclerosis without stenosis    recent echocardiogram not available  No Known Allergies  Current Outpatient Prescriptions  Medication Sig Dispense Refill  . carvedilol (COREG) 6.25 MG tablet Take 6.25 mg by mouth 2 (two) times daily with a meal.      . clopidogrel (PLAVIX) 75 MG tablet TAKE ONE TABLET BY MOUTH EVERY DAY  30 tablet  11  . Cyanocobalamin (VITAMIN B 12 PO) Take 1,000 mcg by mouth.      . doxycycline (VIBRAMYCIN) 100 MG capsule Take 100 mg by mouth 2 (two) times daily. FOR 7 DAYS      . levothyroxine (SYNTHROID, LEVOTHROID) 75 MCG tablet Take 75 mcg by mouth daily before breakfast.      . OXYGEN-HELIUM IN Inhale into the lungs. 2 LITERS      . spironolactone (ALDACTONE) 25 MG tablet Take 12.5 mg by mouth daily.      Marland Kitchen torsemide (DEMADEX) 20 MG tablet Take 20 mg twice  a  Day , after followed instructons  60 tablet  11  . diltiazem (CARDIZEM) 60 MG tablet Take 1 tablet (60 mg total) by mouth 3 (three) times daily.  90 tablet  11   No current facility-administered medications for this visit.    Social History Narrative   L[facility] in an [facility].  He is a widowed father of 4, grandfather of 1. He works out at [employer] 60 minutes at a time 2 to 3 times a week doing arm and leg exercises. He does not walk well because of his foot drop. He does not smoke and has an occasional alcoholic beverage.  He no longer drives.   He is currently in rehabilitation following his hospitalization      ROS: A comprehensive Review of Systems - notable for excessive daytime sleepiness, abdominal bloating and fatigue. Otherwise negative.  PHYSICAL EXAM BP 100/70  Pulse 104  Wt 203 lb (92.08 kg) General appearance: He is in no acute distress Alert and responsive, but closes eyes and is less rapid and would answer questions than before. He is cooperative, appears stated age, Normal mood and affect. Well-nourished well-groomed. Answers questions appropriately. Neck: no adenopathy, no carotid bruit, mild JVD with HJR, supple Lungs: clear to auscultation bilaterally, normal percussion bilaterally -- with this exception of mild bibasal breath sounds being reduced but no dullness to percussion. and Nonlabored, good air movement - on 2 L of oxygen by  nasal cannula Heart:  rapid, irregularly irregular rate/rhythm, S1, S2 normal, no S3 or S4, systolic murmur: systolic ejection 1/6, crescendo, decrescendo and Very soft at 2nd right intercostal space, no click and no rub Abdomen: soft, non-tender; bowel sounds normal; no masses,  no organomegaly Extremities: No clubbing or cyanosis. He has 3+ pitting edema to the knees at least 2 if not 3+ pitting edema up to the mid thighs consistent with anasarca. Mild presacral edema. There is also puffy edema noted in both hands and arms. Pulses: 2+ and symmetric Neurologic: Grossly normal; closes his eyes a lot during the evaluation.  NWG:NFAOZHYQM today: Yes Rate: 104 , Rhythm: Atrial fibrillation with RVR; bifascicular block (R BBB, LAFB) -- with the exception of the increased heart rate, no significant change from last EKG Recent Labs:   03/22/2014: Sodium 143, potassium 3.8, chloride 98, bicarbonate 39, glucose 87, BUN 37/creatinine 1.1. Calcium 7.8. BNP 508.6  03/15/2014: Sodium 148, potassium 4.0, chloride 98, bicarbonate 43, glucose 95, BUN/creatinine 46/1.65. Notable total protein 5.3, albumin 2.9.; Vitamin B12 greater than 2000. TSH 9.34 with T4-5 0.7  (normal suggestive of sick euthyroid)  ASSESSMENT: Severity difficult situation because I do not know what was done during the hospitalization and do not know what the echocardiogram suggested his EF was.  The only thing I know from his last echo was that his LV function was normal a systolic standpoint but there was diastolic dysfunction. No way to know if his episode was related to new systolic dysfunction or simply just acute on chronic diastolic dysfunction. It could also simply just be from worsening renal function with volume overload. He is clearly hypotensive today, and his blood pressure this morning was in the 80s. He felt very dizzy lightheaded.   CHF with unknown LVEF Again, without knowledge of the EF, I don't know that I'm convinced that his symptoms are totally left-sided CHF.  He does have an elevated BNP, but I don't know what her baseline was. However he does have significant dyspnea with oxygen requirement which would suggest if nothing else elevated pulmonary pressures related to lower extremity edema. He really does not have much in the way of orthopnea or PND to suggest left heart failure. When we do know is that he is extremely volume overloaded with anasarca, and his weight is essentially 10 pounds from his previous dry weight of 192 pounds in February. (194 in July of last year)  He doesn't have a lot of blood pressure will, and I think with his hypotension, carvedilol was not a good option for him. It is not control his heart rate which is not helping his effective cardiac output. My plan is to convert from carvedilol back to diltiazem.  We'll need to get his records from [facility] in order to fully know what to do. I am also not privy to recent chest x-ray findings.  The only way to get rid of fluid is to diuresis. He is on torsemide at 20 mg twice a day. Plan: Increase torsemide to 40 mg twice a day for 2 days, then 40 mg a.m., 20 mg p.m. for 2 days and then alternate every  other day doing 20 twice a day one day and 40 mg in a.m. and 20 in p.m. the next day. He will continue this for one week and then return back to 20 twice a day. He will need labs rechecked which will probably be done by his PCP or nephrologist. We will probably need to  except some baseline renal insufficiency.  I am also concerned with his hypotension and not convinced that carvedilol is the best treatment for him. Converting back to diltiazem unless there is some suggestion of decreased EF.   I do know is to be seeing his nephrologist next month so I will see him back in 2 months.  Permanent atrial fibrillation Plan is to wean off carvedilol -- reduced carvedilol to 3.125 mg twice a day with the addition of 60 mg twice a day diltiazem (was previously on 120 twice a day) x2 days. Would then stop carvedilol and increase diltiazem to 60 mg 3 times a day.  Hopefully this will improve heart rate control and reduce hypotension.  Edema of both legs He does have baseline edema and has orders for her leg wraps. He did not have baseline anasarca, therefore I think we need to diurese off at least 10 more pounds. Therefore not able to do this with by mouth medications it may require short rehospitalization for IV diuresis.  Dyspnea He is actually on oxygen which is new for him. He has COPD listed as a possible diagnosis, but also probably had some elevated pulmonary venous pressures. Obtaining his echocardiogram his vital. I do not perceive doing any invasive procedures to evaluate his pulmonary pressures, but an echocardiogram would really help. For now continue home oxygen but hopefully if we were able to aggressive diuresis this will improve.  H/O epistaxis If he has recurrent bleeding, I would recommend holding Plavix for 7 days. This is therefore possible stroke prevention with his A. fib. The relative benefit of Plavix is not or if increased risk if there is bleeding.  Hypotension due to drugs His  blood pressure this morning was way too low, it is still low in the clinic. Carvedilol may be good for her heart failure, but I would not continue with his responsiveness to the alpha component.  If we need to use a beta blocker in the setting of reduced EF, recommend metoprolol succinate or bisoprolol, but for now unless I know for sure his EF is reduced I will use diltiazem which has been better for rate control and less hypotensive inducing   PLAN: CHF with unknown LVEF Again, without knowledge of the EF, I don't know that I'm convinced that his symptoms are totally left-sided CHF.  He does have an elevated BNP, but I don't know what her baseline was. However he does have significant dyspnea with oxygen requirement which would suggest if nothing else elevated pulmonary pressures related to lower extremity edema. He really does not have much in the way of orthopnea or PND to suggest left heart failure. When we do know is that he is extremely volume overloaded with anasarca, and his weight is essentially 10 pounds from his previous dry weight of 192 pounds in February. (194 in July of last year)  He doesn't have a lot of blood pressure will, and I think with his hypotension, carvedilol was not a good option for him. It is not control his heart rate which is not helping his effective cardiac output. My plan is to convert from carvedilol back to diltiazem.  We'll need to get his records from [facility] in order to fully know what to do. I am also not privy to recent chest x-ray findings.  The only way to get rid of fluid is to diuresis. He is on torsemide at 20 mg twice a day. Plan: Increase torsemide to 40 mg twice a  day for 2 days, then 40 mg a.m., 20 mg p.m. for 2 days and then alternate every other day doing 20 twice a day one day and 40 mg in a.m. and 20 in p.m. the next day. He will continue this for one week and then return back to 20 twice a day. He will need labs rechecked which will probably be  done by his PCP or nephrologist. We will probably need to except some baseline renal insufficiency.  I am also concerned with his hypotension and not convinced that carvedilol is the best treatment for him. Converting back to diltiazem unless there is some suggestion of decreased EF.   I do know is to be seeing his nephrologist next month so I will see him back in 2 months.  Permanent atrial fibrillation Plan is to wean off carvedilol -- reduced carvedilol to 3.125 mg twice a day with the addition of 60 mg twice a day diltiazem (was previously on 120 twice a day) x2 days. Would then stop carvedilol and increase diltiazem to 60 mg 3 times a day.  Hopefully this will improve heart rate control and reduce hypotension.  Edema of both legs He does have baseline edema and has orders for her leg wraps. He did not have baseline anasarca, therefore I think we need to diurese off at least 10 more pounds. Therefore not able to do this with by mouth medications it may require short rehospitalization for IV diuresis.  Dyspnea He is actually on oxygen which is new for him. He has COPD listed as a possible diagnosis, but also probably had some elevated pulmonary venous pressures. Obtaining his echocardiogram his vital. I do not perceive doing any invasive procedures to evaluate his pulmonary pressures, but an echocardiogram would really help. For now continue home oxygen but hopefully if we were able to aggressive diuresis this will improve.  H/O epistaxis If he has recurrent bleeding, I would recommend holding Plavix for 7 days. This is therefore possible stroke prevention with his A. fib. The relative benefit of Plavix is not or if increased risk if there is bleeding.  Hypotension due to drugs His blood pressure this morning was way too low, it is still low in the clinic. Carvedilol may be good for her heart failure, but I would not continue with his responsiveness to the alpha component.  If we need to  use a beta blocker in the setting of reduced EF, recommend metoprolol succinate or bisoprolol, but for now unless I know for sure his EF is reduced I will use diltiazem which has been better for rate control and less hypotensive inducing   This was a difficult encounter with intubation data to fully evaluate the patient. There was a significant amount of important information not available in this notably changed and worsened patient. The overall encounter took close to 45 minutes with an additional 15 minutes spent on chart review.  Orders Placed This Encounter  Procedures  . EKG 12-Lead    Followup: 1 yr.  Aubery Date W. Herbie Baltimore, M.D., M.S. [facility] [address] [place], [place]  [address]  [phone] Pager # [phone]

## 2014-03-31 NOTE — Assessment & Plan Note (Addendum)
Plan is to wean off carvedilol -- reduced carvedilol to 3.125 mg twice a day with the addition of 60 mg twice a day diltiazem (was previously on 120 twice a day) x2 days. Would then stop carvedilol and increase diltiazem to 60 mg 3 times a day.  Hopefully this will improve heart rate control and reduce hypotension.

## 2014-03-31 NOTE — Assessment & Plan Note (Signed)
If he has recurrent bleeding, I would recommend holding Plavix for 7 days. This is therefore possible stroke prevention with his A. fib. The relative benefit of Plavix is not or if increased risk if there is bleeding.

## 2014-03-31 NOTE — Assessment & Plan Note (Signed)
He is actually on oxygen which is new for him. He has COPD listed as a possible diagnosis, but also probably had some elevated pulmonary venous pressures. Obtaining his echocardiogram his vital. I do not perceive doing any invasive procedures to evaluate his pulmonary pressures, but an echocardiogram would really help. For now continue home oxygen but hopefully if we were able to aggressive diuresis this will improve.

## 2014-04-14 ENCOUNTER — Telehealth: Payer: Self-pay | Admitting: Cardiology

## 2014-04-14 NOTE — Telephone Encounter (Signed)
Please call concerning his medication and medicare is discontinuing the care for his physical therapy.

## 2014-04-14 NOTE — Telephone Encounter (Signed)
Joan MayansSally Kenney (daughter) Annice PihCalled [employer] and they have palced the case on hold until they received information from us and that we have until 5pm tomorrow to fax the information about Mr.Scarantino    Thanks

## 2014-04-14 NOTE — Telephone Encounter (Signed)
Spoke with sally, pts dtr, medicare is going to stop his coverage for his PT and skilled nursing on 04-15-14. The dtr has appealed but they need a letter from us sent to the company to keep him covered. The dtr reports the reason they are denying the coverage is because they feel the pt needs to be in a skilled facility instead of rehab. The dtr reports he has made significant improvements since his med changes when he was seen two weeks ago. The name of the company is [employer], their fax number is [phone] The case ID# [id number] EB Made aware dr harding is not in the office today, will discuss with sharon his nurse to make sure they have received the paperwork regarding this issue. They would like something done asap.

## 2014-04-15 NOTE — Telephone Encounter (Signed)
I'm happy to help out with this, but I think the PCP can also help.  Marykay LexHARDING,DAVID W, MD

## 2014-04-15 NOTE — Telephone Encounter (Signed)
SPOKE WITH DAUGHTER.She states that this company is working with [employer]. Informed daughter , not sure if any information from Dr Herbie BaltimoreHarding will be helpful . Will send last office notes. SHE STATES UNDERSTANDING.  She also had another question- Father has some incontinent issue  She wanted to know if this is permanent. RN informed daughter, Dr Herbie BaltimoreHarding would not know if that is a temporary or permanent. RN advised-PCP or UROLOGIST  Daughter states patient improvement since visit. Edema is reduced. Daughter also wanted to know where to take him if he needs to go to hospital RN advised if [facility] -she can  let  MD- WHO HIS CARDIOLOGIST possible transfer She verbalized understanding

## 2014-05-10 ENCOUNTER — Telehealth: Payer: Self-pay | Admitting: *Deleted

## 2014-05-10 NOTE — Telephone Encounter (Signed)
Returned signed -reviewed order concerning Plavix Per Dr Herbie Baltimore - it was a PCP D[facility]

## 2014-05-27 ENCOUNTER — Encounter: Payer: Self-pay | Admitting: Cardiology

## 2014-05-27 ENCOUNTER — Ambulatory Visit (INDEPENDENT_AMBULATORY_CARE_PROVIDER_SITE_OTHER): Payer: Medicare Other | Admitting: Cardiology

## 2014-05-27 VITALS — BP 113/72 | HR 87 | Ht 72.0 in | Wt 179.8 lb

## 2014-05-27 DIAGNOSIS — I5032 Chronic diastolic (congestive) heart failure: Secondary | ICD-10-CM

## 2014-05-27 DIAGNOSIS — I4821 Permanent atrial fibrillation: Secondary | ICD-10-CM

## 2014-05-27 DIAGNOSIS — R609 Edema, unspecified: Secondary | ICD-10-CM

## 2014-05-27 DIAGNOSIS — I4891 Unspecified atrial fibrillation: Secondary | ICD-10-CM

## 2014-05-27 DIAGNOSIS — T50904A Poisoning by unspecified drugs, medicaments and biological substances, undetermined, initial encounter: Secondary | ICD-10-CM

## 2014-05-27 DIAGNOSIS — I952 Hypotension due to drugs: Secondary | ICD-10-CM

## 2014-05-27 DIAGNOSIS — R600 Localized edema: Secondary | ICD-10-CM

## 2014-05-27 DIAGNOSIS — I9589 Other hypotension: Secondary | ICD-10-CM

## 2014-05-27 DIAGNOSIS — I452 Bifascicular block: Secondary | ICD-10-CM

## 2014-05-27 MED ORDER — TORSEMIDE 20 MG PO TABS: ORAL_TABLET | ORAL | Status: DC

## 2014-05-27 NOTE — Patient Instructions (Addendum)
SLIDING SCALE FOR WEIGHT GAIN-----dry weight 179 lbs IF weight is above 3-5 lbs dry weight Take 40 mg in morning and 20 mg in evening until reach dry weight. If weight is >5 is follow the above and call the office.  DEMADEX 20 MG  on Monday Wednesday,Friday  Twice a day  All other days take 20 mg one daily    Your physician wants you to follow-up in 2 MONTH Dr Herbie Baltimore. 30 min appointment.  You will receive a reminder letter in the mail two months in advance. If you don't receive a letter, please call our office to schedule the follow-up appointment.

## 2014-05-30 ENCOUNTER — Encounter: Payer: Self-pay | Admitting: Cardiology

## 2014-05-30 NOTE — Assessment & Plan Note (Signed)
SLIDING SCALE FOR WEIGHT GAIN-----dry weight 179 lbs IF weight is above 3-5 lbs dry weight Take 40 mg in morning and 20 mg in evening until reach dry weight. If weight is >5 is follow the above and call the office.  DEMADEX 20 MG  on Monday Wednesday,Friday  Twice a day  All other days take 20 mg one daily  Continue spironolactone. With diastolic failure we will use diltiazem which he tolerated better than carvedilol.

## 2014-05-30 NOTE — Progress Notes (Signed)
PCP: Londell Moh, MD  Clinic Note: Chief Complaint  Patient presents with  . 3 month visit    HPI: Jeremiah Mcmahon is a 80 y.o. male with a PMH below who presents today for  Two-month followup.. I last saw him in July, he was recovering from recent hospital stay where he was extremely volume overloaded. He ended up developing acute diastolic heart failure with anasarca and acute renal injury. The notes indicate that initially he overloaded at that was back to normal. The Notes from hospitalization kept mentioning systolic heart failure but for the most part it was diastolic heart failure. His medications were adjusted where he was taken off of the low dose of diltiazem and put on carvedilol. He became profoundly hypotensive and dizzy with near falls. He is also now switched him to a low dose of diltiazem again since his EF was back to normal. He continues to be on torsemide and spironolactone. Currently taking torsemide 20 twice a day. He is no longer on home oxygen.  Past Medical History  Diagnosis Date  . Atrial fibrillation     rate controlled; asymptomatic  . Swelling of ankle   . Macular degeneration   . H/O spinal stenosis     R foot drop  . COPD, mild   . Chronic diastolic heart failure, NYHA class 2     With recent exacerbation - exacerbated by A. fib RVR    Prior Cardiac Evaluation and Past Surgical History: Past Surgical History  Procedure Laterality Date  . Doppler echocardiography  May 2012    Normal EF greater than 55%. Moderate left and right atria dilation; Mild MR; mild aortic sclerosis without stenosis    most recent echo from 2015 showed preserved EF approximately 50-55%.(Currently do not have report))  Interval History: Since I last saw him, he is hoped to be moving out of the skilled nursing facility portion of his retirement community and into the assisted living portion. He is become notably improved his current regimen. We he dropped a lot of weight  with the adjustments made during his last visit where we really wrapped up his diuretic for several days. He is now down about 24 pounds from his last visit and is feeling much better his edema has doubly improved as has his dyspnea. He is no longer hypotensive and feels more energetic. His BMP from July, when the start of his increased diuretic dosing was 508. He still is very deconditioned in his apartment getting up moving around. He uses a walker but is able to walk from his apartment down to the dining hall. He does this without any anginal symptoms or significant dyspnea. He is getting over a course of antibiotics for a cold/bronchitis, so his breathing is little bit off currently. Not having any fevers or chills. He still has edema but is using Ace wraps and has gotten much better. No signs of cellulitis.  No chest pain or shortness of breath with rest or exertion. No PND, orthopnea to go along with the.  He currently does not notice any palpitations, lightheadedness, dizziness, weakness or syncope/near syncope. No TIA/amaurosis fugax symptoms. No melena, hematochezia, hematuria, or epstaxis. No claudication.  ROS: A comprehensive Review of Systems - was performed Review of Systems  Constitutional: Positive for weight loss.       Dramatic weight loss since last visit of 22 pounds  HENT: Positive for congestion. Negative for nosebleeds.   Eyes: Negative for blurred vision and double vision.  Respiratory: Positive for cough and sputum production.        Recovering from a cold/bronchitis  Cardiovascular: Positive for leg swelling.       Otherwise negative per history of present illness  Gastrointestinal: Negative for blood in stool and melena.  Genitourinary: Negative for hematuria.  Musculoskeletal:       Stiff and deconditioned.  Neurological: Positive for weakness.  Endo/Heme/Allergies: Does not bruise/bleed easily.  Psychiatric/Behavioral: Negative for depression. The patient is not  nervous/anxious.   All other systems reviewed and are negative.   MEDICATIONS AND ALLERGIES REVIEWED IN EPIC -- no change SOCIAL AND FAMILY HISTORY REVIEWED IN EPIC -- no change  Wt Readings from Last 3 Encounters:  05/27/14 179 lb 12.8 oz (81.557 kg)  03/29/14 203 lb (92.08 kg)  10/20/13 192 lb 1.6 oz (87.136 kg)    PHYSICAL EXAM BP 113/72  Pulse 87  Ht 6' (1.829 m)  Wt 179 lb 12.8 oz (81.557 kg)  BMI 24.38 kg/m2 General appearance: He is in no acute distress Alert and responsive, but closes eyes and is less rapid when answering questions than before. He is cooperative, appears stated age, Normal mood and affect. Well-nourished well-groomed. Answers questions appropriately.  Neck: no adenopathy, no carotid bruit, mild JVD with HJR, supple  Lungs: clear to auscultation bilaterally, normal percussion bilaterally -- with this exception of mild bibasal breath sounds being reduced but no dullness to percussion. and Nonlabored, good air movement - on 2 L of oxygen by nasal cannula  Heart: Normal rate, irregularly irregular rhythm, S1, normal, split S2, no S3 or S4, systolic murmur: systolic ejection 1/6, crescendo, decrescendo and Very soft at 2nd right intercostal space, no click and no rub  Abdomen: soft, non-tender; bowel sounds normal; no masses, no organomegaly  Extremities: No clubbing or cyanosis. He has 1-to plus pitting edema to the knees just trace above the knees. No more puffy swelling noted of the arms and hands. Pulses: 2+ and symmetric  Neurologic: Grossly normal; closes his eyes a lot during the evaluation.   Adult ECG Report  Rate: 87 ;  Rhythm: atrial fibrillation normal ventricular response, RBBB, LAFB (< -45)  Narrative Interpretation: Heart rate is decreased since last EKG but otherwise improved  Recent Labs:  I don't have labs from August or September.   ASSESSMENT / PLAN: Overall doing much better with heart rate better controlled and heart failure control. I  explained to him the difference in systolic and diastolic heart failure.  Chronic diastolic heart failure, NYHA class 2; status post recent exacerbation in May 2015 SLIDING SCALE FOR WEIGHT GAIN-----dry weight 179 lbs IF weight is above 3-5 lbs dry weight Take 40 mg in morning and 20 mg in evening until reach dry weight. If weight is >5 is follow the above and call the office.  DEMADEX 20 MG  on Monday Wednesday,Friday  Twice a day  All other days take 20 mg one daily  Continue spironolactone. With diastolic failure we will use diltiazem which he tolerated better than carvedilol.  Hypotension due to drugs Resolved now he is off of blood pressure medications. Simply using diltiazem for rate control and his blood pressure looks fine.  Permanent atrial fibrillation Rate control with diltiazem at 60 mg TID (@ Assisted Living), can use an additional dose if HR increases > 100 & he is symptomatic. We decided that given his age and deconditioning, he would be at too high risk for anticoagulation.  RBBB with left anterior fascicular block  We decided not to do an ischemic evaluation for his A. fib since he was not overly symptomatic in the past. The was not really a significant plan for considering invasive evaluation of the stress test was positive.  Edema of both legs Dramatic improved. He has baseline edema which is about as good as ever looked. Continue using leg wraps. Diuretics as as noted above.    No orders of the defined types were placed in this encounter.   Meds ordered this encounter  Medications  . torsemide (DEMADEX) 20 MG tablet    Sig: Take 20 mg twice  a  Day , after followed instructons    Dispense:  60 tablet    Refill:  11    Take M-W-F.20 MG  ALL OTHER DAYS 20 MG DAILY     Followup: 2 months   HARDING,DAVID W, M.D., M.S. Interventional Cardiologist   Pager # [phone]

## 2014-05-30 NOTE — Assessment & Plan Note (Addendum)
Rate control with diltiazem at 60 mg TID (@ Assisted Living), can use an additional dose if HR increases > 100 & he is symptomatic. We decided that given his age and deconditioning, he would be at too high risk for anticoagulation.

## 2014-05-30 NOTE — Assessment & Plan Note (Signed)
Resolved now he is off of blood pressure medications. Simply using diltiazem for rate control and his blood pressure looks fine.

## 2014-05-30 NOTE — Assessment & Plan Note (Signed)
Dramatic improved. He has baseline edema which is about as good as ever looked. Continue using leg wraps. Diuretics as as noted above.

## 2014-05-30 NOTE — Assessment & Plan Note (Signed)
We decided not to do an ischemic evaluation for his A. fib since he was not overly symptomatic in the past. The was not really a significant plan for considering invasive evaluation of the stress test was positive.

## 2014-08-02 ENCOUNTER — Encounter: Payer: Self-pay | Admitting: Cardiology

## 2014-08-02 ENCOUNTER — Ambulatory Visit (INDEPENDENT_AMBULATORY_CARE_PROVIDER_SITE_OTHER): Payer: Medicare Other | Admitting: Cardiology

## 2014-08-02 VITALS — BP 90/60 | HR 80 | Ht 66.75 in | Wt 187.3 lb

## 2014-08-02 DIAGNOSIS — R0600 Dyspnea, unspecified: Secondary | ICD-10-CM

## 2014-08-02 DIAGNOSIS — I5032 Chronic diastolic (congestive) heart failure: Secondary | ICD-10-CM

## 2014-08-02 DIAGNOSIS — I952 Hypotension due to drugs: Secondary | ICD-10-CM

## 2014-08-02 DIAGNOSIS — I482 Chronic atrial fibrillation: Secondary | ICD-10-CM

## 2014-08-02 DIAGNOSIS — I4821 Permanent atrial fibrillation: Secondary | ICD-10-CM

## 2014-08-02 DIAGNOSIS — J449 Chronic obstructive pulmonary disease, unspecified: Secondary | ICD-10-CM

## 2014-08-02 DIAGNOSIS — R600 Localized edema: Secondary | ICD-10-CM

## 2014-08-02 NOTE — Assessment & Plan Note (Addendum)
Multifactorial - ~COPD, Afib, DHF & deconditioning. On Home O2 - ? Benefit of increasing FiO2 flow for activity to 3L.

## 2014-08-02 NOTE — Assessment & Plan Note (Signed)
Almost back to baseline - can probably return to baseline standing Demedex dose soon -- continue ACE Wraps for legs (if able to use support stockings or light weight compression hose - these are more effective) allow for wrap or stocking/hose free time at night while sleeping.

## 2014-08-02 NOTE — Assessment & Plan Note (Signed)
Low BP today  - OK to hold Spironolactone for SBP < 100 mmHg, if persistently low then also OK to hold mid-day Diltiazem.

## 2014-08-02 NOTE — Assessment & Plan Note (Signed)
Rate is well controlled using very low dose of Diltiazem -- 60 mg tid (if SBP < 100 mmHg - OK to hold 1 dose). As per previous annotation - No anticoagulation due to fall risk from deconditioning.  Pt & daughter are OK with the risks of Plavix vs. Full anticoagulation.

## 2014-08-02 NOTE — Patient Instructions (Signed)
Mr. Jeremiah Mcmahon's Target weight should be at 184 lb (or within -3 lbs of what Nursing Home scale is).   At this time patient may continue taking 20MG  of Torsemide THREE times daily.   *Once the patient reaches target weight of 184 lb please DECREASE Torsemide to 40MG  daily.  **If patient should have an increase in weight above 184 lb then pateint may have 20MG  of Torsemide PRN (as needed) to help with weight decrease.  **If Systolic BP is less than 100 do NOT take spironolactone  Patient may have legs wrapped if compressions stockings are not tolerable for the patient.  Dr. Herbie Mcmahon would like for him to follow-up in 4 to 6 months (30 minute appointment).

## 2014-08-02 NOTE — Assessment & Plan Note (Addendum)
His current weight is about 8 pounds up from his last clinic visit, however he seems relatively euvolemic. I think we can split the difference and status dry weight should be roughly 183 - 184 pounds here which would correlate to ~ 3 lb below his current weight. With normal EF - of to use CCB for rate control of Afib & not Carvedilol / BB.  Continue with Sliding Scale Demedex --Dry wgt 184 lb; Standing dose Demedex 40 mg daily For wgt gain 3-5 lb --> take additional 20 mg in the afternoon until back to dry weight If wgt gain > 5lb call office for directions. If wgt drops < 3 lb - hold dose until back to target  If SBP < 100 mmHg, hold Spironolactone

## 2014-08-02 NOTE — Progress Notes (Signed)
PCP: Pcp Not In System  Clinic Note: Chief Complaint  Patient presents with  . 2 MONTH VISIT    NO CHEST PAIN , NO SHORTNESS OF BREATE- USES OXYGEN- HAS YO STOP TO REST AFTER EXERCISE OR WALKING, EDEMA    HPI: Jeremiah Mcmahon is a 80 y.o. male with a PMH below who presents today for two-month followup of his chronic diastolic heart failure and atrial fibrillation Last visit was in followup from a exacerbation due to an hospital. We have been adjusting his medications. I changed to torsemide dosing to what was supposed to 40 mg daily and then on date: 6 on Friday additional 20 mg in the afternoon to I'm not sure if these instructions were understood, however he apparently was placed on 40 mg daily in October (this may have correlated with a creatinine of 1.8), however with increased 4 pounds recently he has been increased to 20 mg 3 times a day standing.   Past Medical History  Diagnosis Date  . Atrial fibrillation     rate controlled; asymptomatic  . Swelling of ankle   . Macular degeneration   . H/O spinal stenosis     R foot drop  . COPD, mild   . Chronic diastolic heart failure, NYHA class 2     With recent exacerbation - exacerbated by A. fib RVR   Prior Cardiac Evaluation and Past Surgical History: Past Surgical History  Procedure Laterality Date  . Doppler echocardiography  May 2012    Normal EF greater than 55%. Moderate left and right atria dilation; Mild MR; mild aortic sclerosis without stenosis    Interval History: he presents today really with minimal complaints of edema without PND or orthopnea. He doesn't really get around and do too much with activity. He's been noticing some neuropathic pain in his feet that limits his activity. He has had some dizziness with standing but doesn't really stand much now. He able to eating drink relatively well, but is concerned about gaining weight. He denies any chest tightness pressure rest or exertion. No sensation of palpitations or  irregular heartbeats.  He does note that he was not to stop to rest after exercising or walking. As long as he uses his oxygen he is less short of breath and without. He denies weakness, syncope/near syncope, or TIA/amaurosis fugax symptoms.  ROS: A comprehensive was performed. Review of Systems  Constitutional: Negative for fever, chills and malaise/fatigue.  HENT: Negative for nosebleeds.        His nose runs some from the fluid and used to humidify his oxygen  Eyes: Negative for blurred vision.  Respiratory: Positive for shortness of breath. Negative for cough and wheezing.   Cardiovascular: Positive for leg swelling. Negative for claudication.  Gastrointestinal: Negative for blood in stool and melena.  Genitourinary: Negative for hematuria.  Musculoskeletal: Positive for joint pain. Negative for falls.  Neurological: Positive for dizziness. Negative for sensory change, speech change, focal weakness, seizures, loss of consciousness and headaches.  Endo/Heme/Allergies: Negative.   Psychiatric/Behavioral: Positive for memory loss. Negative for depression. The patient is not nervous/anxious.   All other systems reviewed and are negative.   Current Outpatient Prescriptions on File Prior to Visit  Medication Sig Dispense Refill  . clopidogrel (PLAVIX) 75 MG tablet TAKE ONE TABLET BY MOUTH EVERY DAY 30 tablet 11  . diltiazem (CARDIZEM) 60 MG tablet Take 1 tablet (60 mg total) by mouth 3 (three) times daily. 90 tablet 11  . levothyroxine (SYNTHROID, LEVOTHROID)  75 MCG tablet Take 75 mcg by mouth daily before breakfast.    . OXYGEN-HELIUM IN Inhale into the lungs. 2 LITERS    . spironolactone (ALDACTONE) 25 MG tablet Take 12.5 mg by mouth daily.    Marland Kitchen torsemide (DEMADEX) 20 MG tablet Take 20 mg twice  a  Day , after followed instructons (Patient taking differently: Take 20 mg THREE  A  Day , after followed instructons) 60 tablet 11   No current facility-administered medications on file prior  to visit.   ALLERGIES REVIEWED IN EPIC -- No change SOCIAL AND FAMILY HISTORY REVIEWED IN EPIC -- No change  Wt Readings from Last 3 Encounters:  08/02/14 187 lb 4.8 oz (84.959 kg)  05/27/14 179 lb 12.8 oz (81.557 kg)  03/29/14 203 lb (92.08 kg)    PHYSICAL EXAM BP 90/60 mmHg  Pulse 80  Ht 5' 6.75" (1.695 m)  Wt 187 lb 4.8 oz (84.959 kg)  BMI 29.57 kg/m2 General appearance: A&Ox 3 -- but closes eyes; slowly & deliberately answers or asks ?s; appears stated age, Normal mood and affect. Well-nourished well-groomed Neck: no adenopathy, no carotid bruit, mild JVD with HJR, supple  Lungs: CTAB, normal percussion bilaterally -- non-labored, good air movement; 2L O2 by [place] Heart: Normal rate, irregularly irregular rhythm, S1, normal, split S2, no S3 or S4, 1/6 early peaking C-D SEM @ RUSB, no click and no rub  Abdomen: soft, non-tender; bowel sounds normal; no masses, no organomegaly  Extremities: No clubbing or cyanosis. He has 1-to plus pitting edema to the knees just trace above the knees. No more puffy swelling noted of the arms and hands. Pulses: 2+ and symmetric  Neurologic: Grossly normal; closes his eyes a lot during the evaluation.   Adult ECG Report  Rate: 80 ;  Rhythm: atrial fibrillation, premature ventricular contractions (PVC) and aberrantly conducted beats; ~ LAFB vs. LAD with possible Old Inf Infarct, RBBB  Narrative Interpretation: Stable EKG - rate improved  Recent Labs:  Recent labs reviewed , but most recent labs not available.  By report - Cr now 1.3 (down from 1.8)  ASSESSMENT / PLAN: Chronic diastolic heart failure, NYHA class 2; status post recent exacerbation in May 2015 His current weight is about 8 pounds up from his last clinic visit, however he seems relatively euvolemic. I think we can split the difference and status dry weight should be roughly 183 - 184 pounds here which would correlate to ~ 3 lb below his current weight. With normal EF - of to use CCB for  rate control of Afib & not Carvedilol / BB.  Continue with Sliding Scale Demedex --Dry wgt 184 lb; Standing dose Demedex 40 mg daily For wgt gain 3-5 lb --> take additional 20 mg in the afternoon until back to dry weight If wgt gain > 5lb call office for directions. If wgt drops < 3 lb - hold dose until back to target  If SBP < 100 mmHg, hold Spironolactone  Edema of both legs Almost back to baseline - can probably return to baseline standing Demedex dose soon -- continue ACE Wraps for legs (if able to use support stockings or light weight compression hose - these are more effective) allow for wrap or stocking/hose free time at night while sleeping.  Permanent atrial fibrillation Rate is well controlled using very low dose of Diltiazem -- 60 mg tid (if SBP < 100 mmHg - OK to hold 1 dose). As per previous annotation - No anticoagulation due  to fall risk from deconditioning.  Pt & daughter are OK with the risks of Plavix vs. Full anticoagulation.  Hypotension due to drugs Low BP today  - OK to hold Spironolactone for SBP < 100 mmHg, if persistently low then also OK to hold mid-day Diltiazem.  COPD (chronic obstructive pulmonary disease) On Home O2 despite "mild PFT readings"  Dyspnea Multifactorial - ~COPD, Afib, DHF & deconditioning. On Home O2 - ? Benefit of increasing FiO2 flow for activity to 3L.   Mr. Georgina Snell & his daughter as usual present with a myriad of valid ?s and concerns -- in addition the the standard ~15 min H&P portion of the visit, we spent and additional ~25-30 min discussing his Diagnoses, etiologies & treatment choices.   We again discussed Sliding Scale diuretic therapy, & that it is perfectly fine for his PCP to manage his diuretics in the interim.  We discussed anticoagulation options & long-term goals of care. Total time ~40-45 min (in a 30 min scheduled spot)  Orders Placed This Encounter  Procedures  . EKG 12-Lead   No orders of the defined types were placed  in this encounter.    Followup: 4-6 months   Almedia Cordell W, M.D., M.S. Interventional Cardiologist   Pager # [phone]

## 2014-08-02 NOTE — Assessment & Plan Note (Signed)
On Home O2 despite "mild PFT readings"

## 2014-09-08 ENCOUNTER — Telehealth: Payer: Self-pay | Admitting: *Deleted

## 2014-09-08 NOTE — Telephone Encounter (Signed)
FAXED A SIGNED CLARIFICATION TO ORDER FROM 08/04/14 CONCERNING WEARING T.E.D.S. HOSE

## 2014-09-17 DIAGNOSIS — J189 Pneumonia, unspecified organism: Secondary | ICD-10-CM

## 2014-09-17 HISTORY — DX: Pneumonia, unspecified organism: J18.9

## 2014-09-20 ENCOUNTER — Inpatient Hospital Stay (HOSPITAL_COMMUNITY)
Admission: EM | Admit: 2014-09-20 | Discharge: 2014-09-24 | DRG: 177 | Disposition: A | Discharge status: Skilled Nursing Facility | Payer: Medicare Other | Attending: Internal Medicine | Admitting: Internal Medicine

## 2014-09-20 ENCOUNTER — Telehealth (HOSPITAL_COMMUNITY): Payer: Self-pay | Admitting: *Deleted

## 2014-09-20 ENCOUNTER — Encounter (HOSPITAL_COMMUNITY): Payer: Self-pay | Admitting: Emergency Medicine

## 2014-09-20 ENCOUNTER — Emergency Department (HOSPITAL_COMMUNITY): Payer: Medicare Other

## 2014-09-20 DIAGNOSIS — E663 Overweight: Secondary | ICD-10-CM | POA: Diagnosis present

## 2014-09-20 DIAGNOSIS — R339 Retention of urine, unspecified: Secondary | ICD-10-CM | POA: Diagnosis not present

## 2014-09-20 DIAGNOSIS — I4821 Permanent atrial fibrillation: Secondary | ICD-10-CM | POA: Diagnosis present

## 2014-09-20 DIAGNOSIS — Z6827 Body mass index (BMI) 27.0-27.9, adult: Secondary | ICD-10-CM

## 2014-09-20 DIAGNOSIS — I4510 Unspecified right bundle-branch block: Secondary | ICD-10-CM | POA: Diagnosis present

## 2014-09-20 DIAGNOSIS — F0390 Unspecified dementia without behavioral disturbance: Secondary | ICD-10-CM | POA: Diagnosis present

## 2014-09-20 DIAGNOSIS — G9340 Encephalopathy, unspecified: Secondary | ICD-10-CM | POA: Diagnosis present

## 2014-09-20 DIAGNOSIS — I452 Bifascicular block: Secondary | ICD-10-CM | POA: Diagnosis present

## 2014-09-20 DIAGNOSIS — Z9981 Dependence on supplemental oxygen: Secondary | ICD-10-CM

## 2014-09-20 DIAGNOSIS — N179 Acute kidney failure, unspecified: Secondary | ICD-10-CM | POA: Diagnosis present

## 2014-09-20 DIAGNOSIS — I444 Left anterior fascicular block: Secondary | ICD-10-CM | POA: Diagnosis present

## 2014-09-20 DIAGNOSIS — Z515 Encounter for palliative care: Secondary | ICD-10-CM | POA: Diagnosis not present

## 2014-09-20 DIAGNOSIS — R0600 Dyspnea, unspecified: Secondary | ICD-10-CM | POA: Diagnosis present

## 2014-09-20 DIAGNOSIS — Z66 Do not resuscitate: Secondary | ICD-10-CM | POA: Diagnosis present

## 2014-09-20 DIAGNOSIS — J449 Chronic obstructive pulmonary disease, unspecified: Secondary | ICD-10-CM | POA: Diagnosis present

## 2014-09-20 DIAGNOSIS — J690 Pneumonitis due to inhalation of food and vomit: Secondary | ICD-10-CM | POA: Diagnosis present

## 2014-09-20 DIAGNOSIS — R627 Adult failure to thrive: Secondary | ICD-10-CM | POA: Diagnosis present

## 2014-09-20 DIAGNOSIS — Z7902 Long term (current) use of antithrombotics/antiplatelets: Secondary | ICD-10-CM

## 2014-09-20 DIAGNOSIS — E860 Dehydration: Secondary | ICD-10-CM | POA: Diagnosis present

## 2014-09-20 DIAGNOSIS — M21371 Foot drop, right foot: Secondary | ICD-10-CM | POA: Diagnosis present

## 2014-09-20 DIAGNOSIS — Z9181 History of falling: Secondary | ICD-10-CM | POA: Diagnosis not present

## 2014-09-20 DIAGNOSIS — I5032 Chronic diastolic (congestive) heart failure: Secondary | ICD-10-CM | POA: Diagnosis present

## 2014-09-20 DIAGNOSIS — J9621 Acute and chronic respiratory failure with hypoxia: Secondary | ICD-10-CM | POA: Diagnosis present

## 2014-09-20 DIAGNOSIS — H3530 Unspecified macular degeneration: Secondary | ICD-10-CM | POA: Diagnosis present

## 2014-09-20 DIAGNOSIS — Z993 Dependence on wheelchair: Secondary | ICD-10-CM

## 2014-09-20 DIAGNOSIS — E039 Hypothyroidism, unspecified: Secondary | ICD-10-CM | POA: Diagnosis present

## 2014-09-20 DIAGNOSIS — J189 Pneumonia, unspecified organism: Secondary | ICD-10-CM | POA: Diagnosis present

## 2014-09-20 DIAGNOSIS — Z79899 Other long term (current) drug therapy: Secondary | ICD-10-CM

## 2014-09-20 DIAGNOSIS — I9589 Other hypotension: Secondary | ICD-10-CM | POA: Diagnosis present

## 2014-09-20 DIAGNOSIS — I5033 Acute on chronic diastolic (congestive) heart failure: Secondary | ICD-10-CM | POA: Diagnosis present

## 2014-09-20 DIAGNOSIS — I482 Chronic atrial fibrillation: Secondary | ICD-10-CM | POA: Diagnosis present

## 2014-09-20 DIAGNOSIS — R630 Anorexia: Secondary | ICD-10-CM | POA: Insufficient documentation

## 2014-09-20 HISTORY — DX: Major depressive disorder, single episode, unspecified: F32.9

## 2014-09-20 HISTORY — DX: Depression, unspecified: F32.A

## 2014-09-20 HISTORY — DX: Cardiac arrhythmia, unspecified: I49.9

## 2014-09-20 HISTORY — DX: Pneumonia, unspecified organism: J18.9

## 2014-09-20 HISTORY — DX: Heart failure, unspecified: I50.9

## 2014-09-20 LAB — COMPREHENSIVE METABOLIC PANEL
ALT: 12 U/L (ref 0–53)
AST: 25 U/L (ref 0–37)
Albumin: 2.9 g/dL — ABNORMAL LOW (ref 3.5–5.2)
Alkaline Phosphatase: 78 U/L (ref 39–117)
Anion gap: 7 (ref 5–15)
BUN: 22 mg/dL (ref 6–23)
CALCIUM: 8.2 mg/dL — AB (ref 8.4–10.5)
CO2: 34 mmol/L — AB (ref 19–32)
CREATININE: 1.49 mg/dL — AB (ref 0.50–1.35)
Chloride: 98 mEq/L (ref 96–112)
GFR, EST AFRICAN AMERICAN: 43 mL/min — AB (ref >90)
GFR, EST NON AFRICAN AMERICAN: 37 mL/min — AB (ref >90)
GLUCOSE: 90 mg/dL (ref 70–99)
Potassium: 3.7 mmol/L (ref 3.5–5.1)
Sodium: 139 mmol/L (ref 135–145)
Total Bilirubin: 0.6 mg/dL (ref 0.3–1.2)
Total Protein: 6 g/dL (ref 6.0–8.3)

## 2014-09-20 LAB — URINALYSIS, ROUTINE W REFLEX MICROSCOPIC
Bilirubin Urine: NEGATIVE
Glucose, UA: NEGATIVE mg/dL
HGB URINE DIPSTICK: NEGATIVE
KETONES UR: NEGATIVE mg/dL
Leukocytes, UA: NEGATIVE
Nitrite: NEGATIVE
PROTEIN: NEGATIVE mg/dL
Specific Gravity, Urine: 1.016 (ref 1.005–1.030)
UROBILINOGEN UA: 0.2 mg/dL (ref 0.0–1.0)
pH: 6 (ref 5.0–8.0)

## 2014-09-20 LAB — CBC WITH DIFFERENTIAL/PLATELET
BASOS PCT: 0 % (ref 0–1)
Basophils Absolute: 0 10*3/uL (ref 0.0–0.1)
Eosinophils Absolute: 0.2 10*3/uL (ref 0.0–0.7)
Eosinophils Relative: 3 % (ref 0–5)
HEMATOCRIT: 37.4 % — AB (ref 39.0–52.0)
HEMOGLOBIN: 12 g/dL — AB (ref 13.0–17.0)
Lymphocytes Relative: 11 % — ABNORMAL LOW (ref 12–46)
Lymphs Abs: 0.6 10*3/uL — ABNORMAL LOW (ref 0.7–4.0)
MCH: 33.3 pg (ref 26.0–34.0)
MCHC: 32.1 g/dL (ref 30.0–36.0)
MCV: 103.9 fL — ABNORMAL HIGH (ref 78.0–100.0)
MONO ABS: 0.6 10*3/uL (ref 0.1–1.0)
Monocytes Relative: 11 % (ref 3–12)
NEUTROS ABS: 4.3 10*3/uL (ref 1.7–7.7)
NEUTROS PCT: 75 % (ref 43–77)
Platelets: 184 10*3/uL (ref 150–400)
RBC: 3.6 MIL/uL — ABNORMAL LOW (ref 4.22–5.81)
RDW: 14 % (ref 11.5–15.5)
WBC: 5.7 10*3/uL (ref 4.0–10.5)

## 2014-09-20 LAB — MRSA PCR SCREENING: MRSA BY PCR: NEGATIVE

## 2014-09-20 LAB — BRAIN NATRIURETIC PEPTIDE: B Natriuretic Peptide: 397.8 pg/mL — ABNORMAL HIGH (ref 0.0–100.0)

## 2014-09-20 LAB — I-STAT TROPONIN, ED: TROPONIN I, POC: 0.05 ng/mL (ref 0.00–0.08)

## 2014-09-20 MED ORDER — VANCOMYCIN HCL IN DEXTROSE 1-5 GM/200ML-% IV SOLN
1000.0000 mg | Freq: Once | INTRAVENOUS | Status: AC
  Administered 2014-09-20: 1000 mg via INTRAVENOUS
  Filled 2014-09-20: qty 200

## 2014-09-20 MED ORDER — PIPERACILLIN-TAZOBACTAM 3.375 G IVPB
3.3750 g | Freq: Three times a day (TID) | INTRAVENOUS | Status: DC
  Administered 2014-09-20 – 2014-09-24 (×11): 3.375 g via INTRAVENOUS
  Filled 2014-09-20 (×14): qty 50

## 2014-09-20 MED ORDER — VANCOMYCIN HCL IN DEXTROSE 1-5 GM/200ML-% IV SOLN
1000.0000 mg | INTRAVENOUS | Status: DC
Start: 2014-09-21 — End: 2014-09-24
  Administered 2014-09-21 – 2014-09-23 (×3): 1000 mg via INTRAVENOUS
  Filled 2014-09-20 (×4): qty 200

## 2014-09-20 MED ORDER — ENOXAPARIN SODIUM 40 MG/0.4ML ~~LOC~~ SOLN
40.0000 mg | SUBCUTANEOUS | Status: DC
  Filled 2014-09-20 (×2): qty 0.4

## 2014-09-20 MED ORDER — METOPROLOL TARTRATE 1 MG/ML IV SOLN
2.5000 mg | Freq: Four times a day (QID) | INTRAVENOUS | Status: DC
  Administered 2014-09-21: 2.5 mg via INTRAVENOUS
  Filled 2014-09-20 (×7): qty 5

## 2014-09-20 MED ORDER — MORPHINE SULFATE 4 MG/ML IJ SOLN: 4.0000 mg | Freq: Once | INTRAMUSCULAR | Status: DC

## 2014-09-20 MED ORDER — SODIUM CHLORIDE 0.9 % IV SOLN
INTRAVENOUS | Status: AC
  Administered 2014-09-20: 22:00:00 via INTRAVENOUS

## 2014-09-20 MED ORDER — PIPERACILLIN-TAZOBACTAM 3.375 G IVPB: 3.3750 g | Freq: Once | INTRAVENOUS | Status: DC

## 2014-09-20 MED ORDER — SODIUM CHLORIDE 0.9 % IV SOLN
INTRAVENOUS | Status: AC
  Administered 2014-09-20: 17:00:00 via INTRAVENOUS

## 2014-09-20 MED ORDER — ONDANSETRON HCL 4 MG/2ML IJ SOLN: 4.0000 mg | Freq: Four times a day (QID) | INTRAMUSCULAR | Status: DC | PRN

## 2014-09-20 MED ORDER — ASPIRIN 300 MG RE SUPP
300.0000 mg | Freq: Every day | RECTAL | Status: DC
  Filled 2014-09-20: qty 1

## 2014-09-20 MED ORDER — ACETAMINOPHEN 650 MG RE SUPP: 650.0000 mg | Freq: Four times a day (QID) | RECTAL | Status: DC | PRN

## 2014-09-20 MED ORDER — SODIUM CHLORIDE 0.9 % IV BOLUS (SEPSIS)
500.0000 mL | Freq: Once | INTRAVENOUS | Status: AC
  Administered 2014-09-20: 500 mL via INTRAVENOUS

## 2014-09-20 MED ORDER — ALBUTEROL SULFATE (2.5 MG/3ML) 0.083% IN NEBU: 2.5000 mg | INHALATION_SOLUTION | RESPIRATORY_TRACT | Status: DC | PRN

## 2014-09-20 NOTE — ED Notes (Signed)
Attempted to call report to 5w, requested me to call back in a few mins to give report.

## 2014-09-20 NOTE — ED Provider Notes (Signed)
CSN: [id number]     Arrival date & time 09/20/14  1351 History   First MD Initiated Contact with Patient 09/20/14 1354     Chief Complaint  Patient presents with  . Altered Mental Status     (Consider location/radiation/quality/duration/timing/severity/associated sxs/prior Treatment) HPI Comments: Patient is a 81 year old male with history of atrial fibrillation and recent diagnosis of pneumonia. He is currently a resident of [facility] nursing facility or he was found to be more fatigued, less active, and having rattling in his chest for the past 3 days. He was started on antibiotics for presumed pneumonia however is worsening. He has little to history due to age and severity of illness.  Patient is a 81 y.o. male presenting with altered mental status. The history is provided by the patient.  Altered Mental Status Presenting symptoms: lethargy   Severity:  Moderate Most recent episode:  2 days ago Episode history:  Single Timing:  Constant Progression:  Worsening Chronicity:  New Associated symptoms: no abdominal pain and no slurred speech     Past Medical History  Diagnosis Date  . Atrial fibrillation     rate controlled; asymptomatic  . Swelling of ankle   . Macular degeneration   . H/O spinal stenosis     R foot drop  . COPD, mild   . Chronic diastolic heart failure, NYHA class 2     With recent exacerbation - exacerbated by A. fib RVR   Past Surgical History  Procedure Laterality Date  . Doppler echocardiography  May 2012    Normal EF greater than 55%. Moderate left and right atria dilation; Mild MR; mild aortic sclerosis without stenosis    Family History  Problem Relation Age of Onset  . Healthy      Non-contributory due to age   History  Substance Use Topics  . Smoking status: Former Smoker    Types: Pipe  . Smokeless tobacco: Not on file  . Alcohol Use: 0.6 oz/week    1 Glasses of wine per week    Review of Systems  Gastrointestinal: Negative for  abdominal pain.  All other systems reviewed and are negative.     Allergies  Review of patient's allergies indicates no known allergies.  Home Medications   Prior to Admission medications   Medication Sig Start Date End Date Taking? Authorizing Provider  clopidogrel (PLAVIX) 75 MG tablet TAKE ONE TABLET BY MOUTH EVERY DAY 05/15/13  Yes Marykay Lex, MD  diltiazem (CARDIZEM) 60 MG tablet Take 1 tablet (60 mg total) by mouth 3 (three) times daily. 03/29/14  Yes Marykay Lex, MD  guaifenesin (ROBITUSSIN) 100 MG/5ML syrup Take 200 mg by mouth 4 (four) times daily as needed for cough.   Yes Historical Provider, MD  lactobacillus acidophilus (BACID) TABS tablet Take 1 tablet by mouth 2 (two) times daily.   Yes Historical Provider, MD  levofloxacin (LEVAQUIN) 750 MG tablet Take 750 mg by mouth daily. Take for 7 days 09/15/14  Yes Historical Provider, MD  levothyroxine (SYNTHROID, LEVOTHROID) 75 MCG tablet Take 75 mcg by mouth daily before breakfast.   Yes Historical Provider, MD  loratadine (CLARITIN) 10 MG tablet Take 10 mg by mouth daily.   Yes Historical Provider, MD  OXYGEN-HELIUM IN Inhale into the lungs. 2 LITERS   Yes Historical Provider, MD  spironolactone (ALDACTONE) 25 MG tablet Take 12.5 mg by mouth daily.   Yes Historical Provider, MD  torsemide (DEMADEX) 20 MG tablet Take 20 mg twice  a  Day , after followed instructons Patient taking differently: Take 20 mg by mouth daily. Take 1 pill prn For weight over 180 lbs 05/27/14  Yes Marykay Lex, MD   BP 97/64 mmHg  Temp(Src) 98.4 F (36.9 C) (Rectal)  Resp 23  SpO2 93% Physical Exam  Constitutional: He appears well-developed and well-nourished. No distress.  HENT:  Head: Normocephalic and atraumatic.  Neck: Normal range of motion. Neck supple.  Cardiovascular: Normal rate, regular rhythm and normal heart sounds.   No murmur heard. Pulmonary/Chest: Effort normal. No respiratory distress. He has no wheezes. He has rales.   Abdominal: Soft. Bowel sounds are normal. He exhibits no distension. There is no tenderness.  Musculoskeletal: Normal range of motion. He exhibits no edema.  Neurological:  Patient is somnolent, but wake up and respond to questions.  He moves all extremities to command and with purpose.  Remainder of exam difficulty due to severity of illness.   Skin: Skin is warm and dry. He is not diaphoretic.  Nursing note and vitals reviewed.   ED Course  Procedures (including critical care time) Labs Review Labs Reviewed  URINE CULTURE  COMPREHENSIVE METABOLIC PANEL  CBC WITH DIFFERENTIAL  URINALYSIS, ROUTINE W REFLEX MICROSCOPIC  BRAIN NATRIURETIC PEPTIDE  I-STAT TROPOININ, ED    Imaging Review No results found.   EKG Interpretation None      MDM   Final diagnoses:  None    Patient presents with chest congestion, cough, and increased somnolence. He is being treated with by mouth antibiotics for a pneumonia which was found on chest x-ray while at the extended care facility. Chest x-ray today shows a persistent infiltrate along with continued rattling in his chest. He will be given vancomycin and Zosyn to treat for HCAP.  I've spoken with Dr. Waymon Amato from the hospitalist service who agrees to admit for treatment and further care.    Geoffery Lyons, MD 09/20/14 [id number]

## 2014-09-20 NOTE — ED Provider Notes (Signed)
EKG Interpretation  Date/Time:  Monday September 20 2014 17:35:27 EST Ventricular Rate:  103 PR Interval:    QRS Duration: 149 QT Interval:  397 QTC Calculation: 520 R Axis:   -74 Text Interpretation:  Atrial fibrillation Ventricular premature complex Right bundle branch block Inferior infarct, old Anterior infarct, age indeterminate No previous ECGs available Confirmed by Bebe Shaggy  MD, Reve Crocket ([id number]) on 09/20/2014 5:42:22 PM        Joya Gaskins, MD 09/20/14 1742

## 2014-09-20 NOTE — H&P (Signed)
History and Physical  Jeremiah Mcmahon ZOX:[id number] DOB: [birth year] DOA: 09/20/2014  Referring physician: Dr. Geoffery Lyons, EDP PCP: Jeremiah Guys, MD  Outpatient Specialists:  1. Cardiology: Dr. Bryan Lemma.  Chief Complaint: Altered mental status, decreased oxygen saturation, cough and dyspnea.  HPI: Jeremiah Mcmahon is a 81 y.o. male , resident of ALF, with history of chronic diastolic CHF, permanent atrial fibrillation-too high risk for anticoagulation given advanced age and fall risk, RBBB with left anterior fascicular block, bilateral leg edema, hypotension secondary to medications, chronic respiratory failure on home oxygen 2 L/m, presented from assisted living facility with above symptoms. Patient unable to provide any history secondary to altered mental status. History obtained from patient's daughter/healthcare power of attorney Ms. Jeremiah Mcmahon at bedside. Patient apparently has been gradually declining since his hospitalization middle of 2015. Of late he's been mostly wheelchair mobile and ambulates infrequently. A week ago, patient had cough and was diagnosed with pneumonia based on chest x-ray and was started on antibiotics/levofloxacin. 3 days back patient appeared disoriented and sustained a fall in his room and was noted to have oxygen saturations in the 80s off oxygen. Since then patient has continued to have wet sounding cough but is unable to bring up sputum, dyspnea, disorientation, excessive sleepiness, poor oral intake, progressive weakness and ongoing issues with hypoxia and his oxygen was increased to 3.5 L/m. Due to worsening condition and lack of improvement, he presented to the ED where he appears clinically dry, soft blood pressures, creatinine 1.49, chest x-ray shows right lower lobe infiltrate or atelectasis. Hospitalist admission requested.   Review of Systems: All systems reviewed and apart from history of presenting illness, are negative.  Past Medical History  Diagnosis  Date  . Atrial fibrillation     rate controlled; asymptomatic  . Swelling of ankle   . Macular degeneration   . H/O spinal stenosis     R foot drop  . COPD, mild   . Chronic diastolic heart failure, NYHA class 2     With recent exacerbation - exacerbated by A. fib RVR   Past Surgical History  Procedure Laterality Date  . Doppler echocardiography  May 2012    Normal EF greater than 55%. Moderate left and right atria dilation; Mild MR; mild aortic sclerosis without stenosis    Social History:  reports that he has quit smoking. His smoking use included Pipe. He does not have any smokeless tobacco history on file. He reports that he drinks about 0.6 oz of alcohol per week. He reports that he does not use illicit drugs. Widowed. Resident of assisted living facility for the last 5-6 years.  No Known Allergies  Family History  Problem Relation Age of Onset  . Healthy      Non-contributory due to age    Prior to Admission medications   Medication Sig Start Date End Date Taking? Authorizing Provider  clopidogrel (PLAVIX) 75 MG tablet TAKE ONE TABLET BY MOUTH EVERY DAY 05/15/13  Yes Marykay Lex, MD  diltiazem (CARDIZEM) 60 MG tablet Take 1 tablet (60 mg total) by mouth 3 (three) times daily. 03/29/14  Yes Marykay Lex, MD  guaifenesin (ROBITUSSIN) 100 MG/5ML syrup Take 200 mg by mouth 4 (four) times daily as needed for cough.   Yes Historical Provider, MD  lactobacillus acidophilus (BACID) TABS tablet Take 1 tablet by mouth 2 (two) times daily.   Yes Historical Provider, MD  levofloxacin (LEVAQUIN) 750 MG tablet Take 750 mg by mouth daily. Take for  7 days 09/15/14  Yes Historical Provider, MD  levothyroxine (SYNTHROID, LEVOTHROID) 75 MCG tablet Take 75 mcg by mouth daily before breakfast.   Yes Historical Provider, MD  loratadine (CLARITIN) 10 MG tablet Take 10 mg by mouth daily.   Yes Historical Provider, MD  OXYGEN-HELIUM IN Inhale into the lungs. 2 LITERS   Yes Historical Provider,  MD  spironolactone (ALDACTONE) 25 MG tablet Take 12.5 mg by mouth daily.   Yes Historical Provider, MD  torsemide (DEMADEX) 20 MG tablet Take 20 mg twice  a  Day , after followed instructons Patient taking differently: Take 20 mg by mouth daily. Take 1 pill prn For weight over 180 lbs 05/27/14  Yes Marykay Lex, MD   Physical Exam: Filed Vitals:   09/20/14 1614 09/20/14 1630 09/20/14 1700 09/20/14 1716  BP: 108/62 101/70 99/73 99/77   Pulse: 98 89 85 98  Temp:    97.5 F (36.4 C)  TempSrc:    Oral  Resp: SpO2: 100% 98% 99% 98%     General exam: Moderately built and nourished elderly frail chronically ill-looking male patient, lying comfortably supine on the gurney in no obvious distress. Does not look septic or toxic.  Head, eyes and ENT: Nontraumatic and normocephalic. Pupils equally reacting to light and accommodation. Oral mucosa dry.  Neck: Supple. No JVD, carotid bruit or thyromegaly.  Lymphatics: No lymphadenopathy.  Respiratory system: Diminished breath sounds in the bases without wheezing, rhonchi or crackles. Rest of lung fields clear to auscultation. No increased work of breathing.  Cardiovascular system: S1 and S2 heard, irregularly irregular. No JVD, murmurs, gallops, clicks or pedal edema. Telemetry: A. fib, low voltage with controlled ventricular rate.  Gastrointestinal system: Abdomen is nondistended, soft and nontender. Normal bowel sounds heard. No organomegaly or masses appreciated.  Central nervous system: Drowsy but easily arousable to call. Patient will briefly open eyes and drift back to sleep. Oriented only to self and "". Follow simple instructions. No focal neurological deficits.  Extremities: Symmetric 5 x 5 power. Peripheral pulses symmetrically felt.   Skin: No rashes or acute findings.  Musculoskeletal system: Negative exam.  Psychiatry: Pleasant and cooperative.   Labs on Admission:  Basic Metabolic Panel:  Recent  Labs Lab 09/20/14 1417  NA 139  K 3.7  CL 98  CO2 34*  GLUCOSE 90  BUN 22  CREATININE 1.49*  CALCIUM 8.2*   Liver Function Tests:  Recent Labs Lab 09/20/14 1417  AST 25  ALT 12  ALKPHOS 78  BILITOT 0.6  PROT 6.0  ALBUMIN 2.9*   No results for input(s): LIPASE, AMYLASE in the last 168 hours. No results for input(s): AMMONIA in the last 168 hours. CBC:  Recent Labs Lab 09/20/14 1417  WBC 5.7  NEUTROABS 4.3  HGB 12.0*  HCT 37.4*  MCV 103.9*  PLT 184   Cardiac Enzymes: No results for input(s): CKTOTAL, CKMB, CKMBINDEX, TROPONINI in the last 168 hours.  BNP (last 3 results) No results for input(s): PROBNP in the last 8760 hours. CBG: No results for input(s): GLUCAP in the last 168 hours.  Radiological Exams on Admission: Dg Chest 2 View  09/20/2014   CLINICAL DATA:  Per EMS, pt coming from [facility] nursing facility for increased lethargy x3 days. Pt was recently diagnosed with pneumonia and has 3 days of his antibiotics left. Pt is always on O2 3 L [place] at home. Pt has hx of A-fib, COPD, chronic diastolic heart failure. Past-smoker.  EXAM:  CHEST  2 VIEW  COMPARISON:  10/20/2013  FINDINGS: The heart is enlarged. There is dense opacity at the right lung base which obscures the hemidiaphragm. Right pleural effusion is present. Small left pleural effusion is present. No pulmonary edema.  IMPRESSION: 1. Cardiomegaly. 2. Right lower lobe infiltrate or atelectasis. 3. Bilateral pleural effusions, right greater than left.   Electronically Signed   By: Rosalie Gums M.D.   On: 09/20/2014 15:14    EKG: None seen in Epic for today.  Assessment/Plan Principal Problem:   HCAP (healthcare-associated pneumonia) Active Problems:   Permanent atrial fibrillation   COPD (chronic obstructive pulmonary disease)   RBBB with left anterior fascicular block   Hypothyroid   Chronic diastolic heart failure, NYHA class 2; status post recent exacerbation in May 2015   Acute encephalopathy    Acute on chronic respiratory failure with hypoxia   Failure to thrive in adult   Acute kidney injury   Do not resuscitate   1. Healthcare associated pneumonia versus aspiration pneumonia related to Altered mental status: Admit to medical floor. Nothing by mouth until consistently alert and able to tolerate by mouth. May require speech therapy consultation to evaluate swallowing. Treat empirically with IV vancomycin and Zosyn. Failed outpatient levofloxacin treatment. Patient will not be able to provide sputum for culture. 2. Dehydration: Secondary to poor oral intake and diuretics. Temporarily hold diuretics and gently hydrate with IV fluids. Reassess in a.m. 3. Acute kidney injury versus chronic kidney disease: Baseline creatinine is not known. Secondary to dehydration. Brief IV fluids and follow up BMP in a.m. 4. Acute encephalopathy: Secondary to acute illness including pneumonia, dehydration, acute kidney injury and possible antibiotics complicating advanced age and unsure about prior history of dementia. No focal deficits. Treat underlying cause and reassess in a.m. If not improving adequately, may consider CT head but not sure how that will change overall management in this elderly frail patient. 5. Permanent atrial fibrillation: Controlled ventricular rate. Unable to give by mouth medications. We'll use metoprolol IV as BP tolerates. Not candidate for anticoagulation. Use rectal aspirin while unable to take oral Plavix. 6. Acute on chronic hypoxic respiratory failure: Secondary to pneumonia complicating underlying COPD. 7. Chronic diastolic CHF: Clinically dehydrated. Temporarily hold Lasix. 8. DO NOT RESUSCITATE: Confirmed with patient's healthcare power of attorney/daughter. 9. Failure to thrive: Secondary to multiple comorbidities in elderly frail patient. Not expected to do well long-term. Offered palliative care consultation. Daughter wishes to think over it and will get back to [place].  10.   COPD: Stable  11.  History of RBBB with anterior fascicular block: 12. History of hypothyroid: Resume Synthroid when able to take by mouth.     Code Status:DO NOT RESUSCITATE: Confirmed with healthcare power of attorney. Has out of facility DNR form at bedside. Family Communication: Discussed with patients daughter/healthcare power of attorney Ms Jeremiah Mcmahon at bedside. Disposition Plan: To be determined   Time spent: 70 minutes.  Marcellus Scott, MD, FACP, FHM. [facility] Pager [phone]  If 7PM-7AM, please contact night-coverage [email or web address] Password [email or web address] 09/20/2014, 5:29 PM

## 2014-09-20 NOTE — ED Notes (Signed)
ekg shown to dr Bebe Shaggy.

## 2014-09-20 NOTE — Telephone Encounter (Signed)
Patients wife left a message with the answering service stating that shes taking him to the hospital because she cant do anything else with him.

## 2014-09-20 NOTE — ED Notes (Signed)
Admitting physician at bedside

## 2014-09-20 NOTE — ED Notes (Signed)
Per EMS, pt coming from [facility] facility for increased lethargy x3 days. Pt was recently diagnosed with pneumonia and has 3 days of his antibiotics left. Pt is always on O2 3L  [place] at home.

## 2014-09-20 NOTE — Progress Notes (Signed)
ANTIBIOTIC CONSULT NOTE - INITIAL  Pharmacy Consult for Vancomycin and Zosyn Indication: rule out pneumonia  No Known Allergies  Patient Measurements: Height:  (175.3 cm) Weight: 177 lb 12.8 oz (80.65 kg) IBW/kg (Calculated) : 70.7  Vital Signs: Temp: 98.3 F (36.8 C) (01/04 1829) Temp Source: Oral (01/04 1829) BP: 103/76 mmHg (01/04 1829) Pulse Rate: 95 (01/04 1829) Intake/Output from previous day:   Intake/Output from this shift: Total I/O In: -  Out: 75 [Urine:75]  Labs:  Recent Labs  09/20/14 1417  WBC 5.7  HGB 12.0*  PLT 184  CREATININE 1.49*   Estimated Creatinine Clearance: 27.7 mL/min (by C-G formula based on Cr of 1.49). No results for input(s): VANCOTROUGH, VANCOPEAK, VANCORANDOM, GENTTROUGH, GENTPEAK, GENTRANDOM, TOBRATROUGH, TOBRAPEAK, TOBRARND, AMIKACINPEAK, AMIKACINTROU, AMIKACIN in the last 72 hours.   Microbiology: No results found for this or any previous visit (from the past 720 hour(s)).  Medical History: Past Medical History  Diagnosis Date  . Atrial fibrillation     rate controlled; asymptomatic  . Swelling of ankle   . Macular degeneration   . H/O spinal stenosis     R foot drop  . COPD, mild   . Chronic diastolic heart failure, NYHA class 2     With recent exacerbation - exacerbated by A. fib RVR    Medications:  Prescriptions prior to admission  Medication Sig Dispense Refill Last Dose  . clopidogrel (PLAVIX) 75 MG tablet TAKE ONE TABLET BY MOUTH EVERY DAY 30 tablet 11 09/19/2014 at Unknown time  . diltiazem (CARDIZEM) 60 MG tablet Take 1 tablet (60 mg total) by mouth 3 (three) times daily. 90 tablet 11 09/19/2014 at Unknown time  . guaifenesin (ROBITUSSIN) 100 MG/5ML syrup Take 200 mg by mouth 4 (four) times daily as needed for cough.   09/19/2014 at Unknown time  . lactobacillus acidophilus (BACID) TABS tablet Take 1 tablet by mouth 2 (two) times daily.   09/19/2014 at Unknown time  . levofloxacin (LEVAQUIN) 750 MG tablet Take 750  mg by mouth daily. Take for 7 days   09/19/2014 at Unknown time  . levothyroxine (SYNTHROID, LEVOTHROID) 75 MCG tablet Take 75 mcg by mouth daily before breakfast.   09/19/2014 at Unknown time  . loratadine (CLARITIN) 10 MG tablet Take 10 mg by mouth daily.   09/19/2014 at Unknown time  . OXYGEN-HELIUM IN Inhale into the lungs. 2 LITERS   09/19/2014 at Unknown time  . spironolactone (ALDACTONE) 25 MG tablet Take 12.5 mg by mouth daily.   09/19/2014 at Unknown time  . torsemide (DEMADEX) 20 MG tablet Take 20 mg twice  a  Day , after followed instructons (Patient taking differently: Take 20 mg by mouth daily. Take 1 pill prn For weight over 180 lbs) 60 tablet 11 09/19/2014 at Unknown time   Scheduled:  . aspirin  300 mg Rectal Daily  . enoxaparin (LOVENOX) injection  40 mg Subcutaneous Q24H  . metoprolol  2.5 mg Intravenous 4 times per day  . piperacillin-tazobactam (ZOSYN)  IV  3.375 g Intravenous Once  . sodium chloride  500 mL Intravenous Once   Infusions:  . sodium chloride     Assessment: 81yo male with history of chronic respiratory failure on home oxygen at 2L/min presents with AMS, hypoxia, and cough. Pharmacy is consulted to dose vancomycin and zosyn for possible HCAP. Pt is afebrile, WBC wnl, sCr 1.49 with CrCl ~ 35 mL/min.  Goal of Therapy:  Vancomycin trough level 15-20 mcg/ml  Plan:  Vancomycin  1g IV q24h Zosyn 3.375g IV q8h Measure antibiotic drug levels at steady state Follow up culture results, renal function, and clinical course  Arlean Hopping. Newman Pies, PharmD Clinical Pharmacist Pager [phone] 09/20/2014,6:31 PM

## 2014-09-21 ENCOUNTER — Encounter (HOSPITAL_COMMUNITY): Payer: Self-pay | Admitting: General Practice

## 2014-09-21 DIAGNOSIS — I5032 Chronic diastolic (congestive) heart failure: Secondary | ICD-10-CM

## 2014-09-21 DIAGNOSIS — J189 Pneumonia, unspecified organism: Secondary | ICD-10-CM

## 2014-09-21 DIAGNOSIS — I482 Chronic atrial fibrillation: Secondary | ICD-10-CM

## 2014-09-21 LAB — URINE CULTURE
Colony Count: NO GROWTH
Culture: NO GROWTH
Special Requests: NORMAL

## 2014-09-21 LAB — CBC
HCT: 38.3 % — ABNORMAL LOW (ref 39.0–52.0)
HEMOGLOBIN: 12.3 g/dL — AB (ref 13.0–17.0)
MCH: 34.4 pg — AB (ref 26.0–34.0)
MCHC: 32.1 g/dL (ref 30.0–36.0)
MCV: 107 fL — ABNORMAL HIGH (ref 78.0–100.0)
PLATELETS: 200 10*3/uL (ref 150–400)
RBC: 3.58 MIL/uL — AB (ref 4.22–5.81)
RDW: 13.9 % (ref 11.5–15.5)
WBC: 6.3 10*3/uL (ref 4.0–10.5)

## 2014-09-21 LAB — CK TOTAL AND CKMB (NOT AT ARMC)
CK TOTAL: 142 U/L (ref 7–232)
CK, MB: 2.3 ng/mL (ref 0.3–4.0)
CK, MB: 2.2 ng/mL (ref 0.3–4.0)
RELATIVE INDEX: 1.7 (ref 0.0–2.5)
Relative Index: 1.5 (ref 0.0–2.5)
Total CK: 134 U/L (ref 7–232)

## 2014-09-21 LAB — BASIC METABOLIC PANEL
ANION GAP: 6 (ref 5–15)
BUN: 21 mg/dL (ref 6–23)
CALCIUM: 8.3 mg/dL — AB (ref 8.4–10.5)
CO2: 35 mmol/L — ABNORMAL HIGH (ref 19–32)
Chloride: 99 mEq/L (ref 96–112)
Creatinine, Ser: 1.44 mg/dL — ABNORMAL HIGH (ref 0.50–1.35)
GFR calc Af Amer: 45 mL/min — ABNORMAL LOW (ref >90)
GFR, EST NON AFRICAN AMERICAN: 39 mL/min — AB (ref >90)
GLUCOSE: 69 mg/dL — AB (ref 70–99)
Potassium: 3.9 mmol/L (ref 3.5–5.1)
Sodium: 140 mmol/L (ref 135–145)

## 2014-09-21 LAB — LACTIC ACID, PLASMA: Lactic Acid, Venous: 2.2 mmol/L (ref 0.5–2.2)

## 2014-09-21 LAB — BRAIN NATRIURETIC PEPTIDE: B NATRIURETIC PEPTIDE 5: 607.7 pg/mL — AB (ref 0.0–100.0)

## 2014-09-21 LAB — TROPONIN I
Troponin I: 0.06 ng/mL — ABNORMAL HIGH (ref <0.031)
Troponin I: 0.06 ng/mL — ABNORMAL HIGH (ref <0.031)

## 2014-09-21 MED ORDER — CLOPIDOGREL BISULFATE 75 MG PO TABS
75.0000 mg | ORAL_TABLET | Freq: Every day | ORAL | Status: DC
  Administered 2014-09-21 – 2014-09-24 (×4): 75 mg via ORAL
  Filled 2014-09-21 (×4): qty 1

## 2014-09-21 MED ORDER — LEVOTHYROXINE SODIUM 75 MCG PO TABS
75.0000 ug | ORAL_TABLET | Freq: Every day | ORAL | Status: DC
  Administered 2014-09-22 – 2014-09-24 (×3): 75 ug via ORAL
  Filled 2014-09-21 (×5): qty 1

## 2014-09-21 MED ORDER — SODIUM CHLORIDE 0.9 % IV SOLN
INTRAVENOUS | Status: DC
  Administered 2014-09-21: 16:00:00 via INTRAVENOUS

## 2014-09-21 MED ORDER — ENOXAPARIN SODIUM 30 MG/0.3ML ~~LOC~~ SOLN
30.0000 mg | SUBCUTANEOUS | Status: DC
  Administered 2014-09-21 – 2014-09-23 (×3): 30 mg via SUBCUTANEOUS
  Filled 2014-09-21 (×4): qty 0.3

## 2014-09-21 MED ORDER — DILTIAZEM HCL 60 MG PO TABS
60.0000 mg | ORAL_TABLET | Freq: Three times a day (TID) | ORAL | Status: DC
  Filled 2014-09-21 (×3): qty 1

## 2014-09-21 MED ORDER — METOPROLOL TARTRATE 1 MG/ML IV SOLN
5.0000 mg | Freq: Four times a day (QID) | INTRAVENOUS | Status: DC
  Filled 2014-09-21 (×3): qty 5

## 2014-09-21 MED ORDER — GUAIFENESIN 100 MG/5ML PO SYRP
200.0000 mg | ORAL_SOLUTION | Freq: Four times a day (QID) | ORAL | Status: DC | PRN
  Filled 2014-09-21: qty 10

## 2014-09-21 MED ORDER — SODIUM CHLORIDE 0.9 % IV BOLUS (SEPSIS)
250.0000 mL | Freq: Once | INTRAVENOUS | Status: AC
  Administered 2014-09-21: 250 mL via INTRAVENOUS

## 2014-09-21 MED ORDER — LORATADINE 10 MG PO TABS
10.0000 mg | ORAL_TABLET | Freq: Every day | ORAL | Status: DC
  Administered 2014-09-21 – 2014-09-24 (×4): 10 mg via ORAL
  Filled 2014-09-21 (×4): qty 1

## 2014-09-21 MED ORDER — BACID PO TABS
1.0000 | ORAL_TABLET | Freq: Two times a day (BID) | ORAL | Status: DC
  Administered 2014-09-21: 1 via ORAL
  Filled 2014-09-21 (×3): qty 1

## 2014-09-21 MED ORDER — CETYLPYRIDINIUM CHLORIDE 0.05 % MT LIQD
7.0000 mL | Freq: Two times a day (BID) | OROMUCOSAL | Status: DC
  Administered 2014-09-21 – 2014-09-24 (×7): 7 mL via OROMUCOSAL

## 2014-09-21 MED ORDER — DIGOXIN 0.25 MG/ML IJ SOLN
0.2500 mg | Freq: Once | INTRAMUSCULAR | Status: AC
  Administered 2014-09-21: 0.25 mg via INTRAVENOUS
  Filled 2014-09-21 (×2): qty 1

## 2014-09-21 MED ORDER — METOPROLOL TARTRATE 1 MG/ML IV SOLN
5.0000 mg | Freq: Three times a day (TID) | INTRAVENOUS | Status: DC
  Administered 2014-09-21 – 2014-09-22 (×3): 5 mg via INTRAVENOUS
  Filled 2014-09-21 (×6): qty 5

## 2014-09-21 MED ORDER — TORSEMIDE 20 MG PO TABS: 20.0000 mg | ORAL_TABLET | Freq: Every day | ORAL | Status: DC

## 2014-09-21 NOTE — Progress Notes (Signed)
On-call provider notified of elevated BNP and running fluids. Provider instructed RN to continue with remainder of 1L infusing then saline lock as previously planned. Will continue to monitor.

## 2014-09-21 NOTE — Consult Note (Signed)
CARDIOLOGY CONSULT NOTE   Patient ID: Jeremiah Mcmahon MRN: [id number], DOB/AGE: 12-13-1913   Admit date: 09/20/2014 Date of Consult: 09/21/2014   Primary Physician: Darlina GuysPOWELL, JERRY, MD Primary Cardiologist: Dr. Herbie BaltimoreHarding  Pt. Profile  81 year old Caucasian male with history of permanent atrial fibrillation, COPD, chronic multifactorial dyspnea on 24 hrs 2 L home oxygen, chronic diastolic heart failure and hypothyroidism present with AMS   Problem List  Past Medical History  Diagnosis Date  . Atrial fibrillation     rate controlled; asymptomatic  . Swelling of ankle   . Macular degeneration   . H/O spinal stenosis     R foot drop  . COPD, mild   . Chronic diastolic heart failure, NYHA class 2     With recent exacerbation - exacerbated by A. fib RVR  . Dysrhythmia     HX OF ATRIAL FIBRILATION  . CHF (congestive heart failure)   . Depression   . Pneumonia 09/2014    Past Surgical History  Procedure Laterality Date  . Doppler echocardiography  May 2012    Normal EF greater than 55%. Moderate left and right atria dilation; Mild MR; mild aortic sclerosis without stenosis      Allergies  No Known Allergies  HPI   The patient is a 81 year old Caucasian male with history of permanent atrial fibrillation, COPD, chronic multifactorial dyspnea on 24 hrs 2 L home oxygen, chronic diastolic heart failure and hypothyroidism who has been followed by Dr. Herbie BaltimoreHarding as outpatient. He is deemed not a good candidate for systemic anticoagulation given his advanced age and risk for fall. He is currently taking Plavix at home. He has to echocardiogram in June 2015. The first echocardiogram was done while his heart rate was in the 110 to 130s range with A. fib with RVR, EF was 45-50%. He had a repeat echocardiogram on June 22, EF was noted to be 55-60% once HR was controlled. His last [facility] with [facility]care office was in November 2015. He has been on a sliding scale Demadex at home. Per Dr. Herbie BaltimoreHarding, his  baseline dry weight should be around 183-184 pounds. He has been on 40 mg daily of Demadex. He has been instructed to take 20 mg additional Demadex in the afternoon if his weight increased by more than 3 pounds. If his weight dropped by more than 3 pounds, he has been instructed to hold his Demadex until he is back to target weight. If his SBP <100 mmHg, he has been instructed to hold spironolactone. His last weight on November 16 while in the cardiology clinic was 187 pounds.  [facility] from assisted living facility with altered mental status, decreased oxygen saturation, cough and dyspnea. Unfortunately, patient is a poor historian given his advanced age and poor mental status. Per family, he has been gradually declining since his hospitalization in mid 2015. Lately, he has been largely wheelchair-bound and ambulates very infrequently. He was recently diagnosed with pneumonia based on chest x-ray and placed on Levaquin. Prior to admission, he appeared to be disoriented and had a fall in his room and noted to have O2 saturation in the 80s off oxygen. Patient does admit to have a cough, however unable to bring up any sputum. While in the ED, he appeared clinically dry with soft blood pressure. His creatinine is 1.49 whereas his baseline creatinine is about 1.3. Chest x-ray shows right lower lobe infiltrate or atelectasis. He was admitted to internal medicine service and placed  on Zosyn and vancomycin for potential healthcare associated pneumonia. His diuretic was held and he was gently hydrated with IV fluid. Overnight, patient's heart rate has been persistently in the high 90s to low 100s. His blood pressure is borderline in the 90s to 100s after his home diltiazem was held. There was also concern for aspiration, patient has been seen by speech therapy who placed him on dysphagia diet. No further subjective testing was ordered by speech therapy given the fact that family  wish conservative management for now. Cardiology has been consulted for atrial fibrillation with RVR.  Inpatient Medications  . antiseptic oral rinse  7 mL Mouth Rinse BID  . clopidogrel  75 mg Oral Daily  . enoxaparin (LOVENOX) injection  30 mg Subcutaneous Q24H  . lactobacillus acidophilus  1 tablet Oral BID  . [START ON 09/22/2014] levothyroxine  75 mcg Oral QAC breakfast  . loratadine  10 mg Oral Daily  . piperacillin-tazobactam (ZOSYN)  IV  3.375 g Intravenous Q8H  . vancomycin  1,000 mg Intravenous Q24H    Family History Family History  Problem Relation Age of Onset  . Healthy      Non-contributory due to age     Social History History   Social History  . Marital Status: Divorced    Spouse Name: N/A    Number of Children: N/A  . Years of Education: N/A   Occupational History  . Not on file.   Social History Main Topics  . Smoking status: Former Smoker    Types: Pipe  . Smokeless tobacco: Never Used     Comment: quit smoking many years ago   . Alcohol Use: 0.6 oz/week    1 Glasses of wine per week  . Drug Use: No  . Sexual Activity: Not on file   Other Topics Concern  . Not on file   Social History Narrative   Lives in an [facility] community.  He is a widowed father of 4, grandfather of 1. He works out at [employer] 60 minutes at a time 2 to 3 times a week doing arm and leg exercises. He does not walk well because of his foot drop. He does not smoke and has an occasional alcoholic beverage.  He no longer drives.   He is looking into getting into swimming this fall.     Review of Systems  General:  No chills, fever, night sweats or weight changes.  Cardiovascular:  No chest pain, edema, orthopnea, palpitations, paroxysmal nocturnal dyspnea. +dyspnea on exertion Dermatological: No rash, lesions/masses Respiratory: +cough, chronic dyspnea Urologic: No hematuria, dysuria Abdominal:   No nausea, vomiting, diarrhea, bright red blood per rectum,  melena, or hematemesis Neurologic:  No visual changes, wkns +changes in mental status. All other systems reviewed and are otherwise negative except as noted above.  Physical Exam  Blood pressure 100/60, pulse 111, temperature 98.2 F (36.8 C), temperature source Oral, resp. rate 24, height  (1.753 m), weight 173 lb 12.8 oz (78.835 kg), SpO2 97 %.  General: Pleasant, NAD. Frail looking Psych: Normal affect. Neuro: Alert and oriented X 3. Moves all extremities spontaneously. HEENT: Normal  Neck: Supple without bruits or JVD. Lungs:  Resp regular and unlabored. CTA with markedly diminished breath sound in R base Heart: tachycardic, irregular. no s3, s4, or murmurs. Abdomen: Soft, non-tender, non-distended, BS + x 4.  Extremities: No clubbing, cyanosis or edema. DP/PT/Radials 2+ and equal bilaterally.  Labs  No results for input(s): CKTOTAL, CKMB,  TROPONINI in the last 72 hours. Lab Results  Component Value Date   WBC 6.3 09/21/2014   HGB 12.3* 09/21/2014   HCT 38.3* 09/21/2014   MCV 107.0* 09/21/2014   PLT 200 09/21/2014    Recent Labs Lab 09/20/14 1417 09/21/14 0410  NA 139 140  K 3.7 3.9  CL 98 99  CO2 34* 35*  BUN 22 21  CREATININE 1.49* 1.44*  CALCIUM 8.2* 8.3*  PROT 6.0  --   BILITOT 0.6  --   ALKPHOS 78  --   ALT 12  --   AST 25  --   GLUCOSE 90 69*    Radiology/Studies  Dg Chest 2 View  09/20/2014   CLINICAL DATA:  Per EMS, pt coming from [facility] nursing facility for increased lethargy x3 days. Pt was recently diagnosed with pneumonia and has 3 days of his antibiotics left. Pt is always on O2 3 L [place] at home. Pt has hx of A-fib, COPD, chronic diastolic heart failure. Past-smoker.  EXAM: CHEST  2 VIEW  COMPARISON:  10/20/2013  FINDINGS: The heart is enlarged. There is dense opacity at the right lung base which obscures the hemidiaphragm. Right pleural effusion is present. Small left pleural effusion is present. No pulmonary edema.  IMPRESSION: 1.  Cardiomegaly. 2. Right lower lobe infiltrate or atelectasis. 3. Bilateral pleural effusions, right greater than left.   Electronically Signed   By: Rosalie Gums M.D.   On: 09/20/2014 15:14    ECG  A-fib with HR 109, RBBB  ASSESSMENT AND PLAN  1. AMS  - felt to be related to recent medication, advanced age and dehydration; possible infection.    - currently receiving ABX and IVF.  Will need to limit amount of IVF given h/o chronic diastolic HF  (per IVF order, plan for 1L IVF then [facility])  2. Dyspnea  - chronic multifactorial dyspnea on 24 hrs 2 L home oxygen  - CXR shows R basilar pleural effusion  This was present in June 2015  - possible also has R basilar HAP, currently being treated with antibiotics  3. Permanent atrial fibrillation  - not an anticoagulation candidate given age and fall risk  - on diltiazem  TID at home, currently held given borderline BP  - place on  Q8hr for rate control. Do not favor dig with age, renal function, overall potential for toxicity  4. Chronic diastolic HF  - baseline weight 183-184, currently 170s (Question if wts comparable )  - discussed with Dr. Tenny Craw, will order another urinalsys and BNP  5. Hypotension: Receiving  IVF, currently SBP in 90-100s, should be able to tolerate IV lopressor  6. Acute on Chronic renal insufficiency, class III   7. COPD  8. Hypothyroidism 9. Elevated trop: Triv elevation.   - would not attempt any invasive workup in the 81 yo frail male. Continue plavix.  SignedAzalee Course, PA-C 09/21/2014, 4:16 PM  Patient seen and examined  I have amended note above by Harrell Lark to reflect my findings.  Receiving IV fluids  Need to watch for overload Check labs as noted above Low dose lopressor for HR.   Review of CXR   R sided findings appear chronic.    Dietrich Pates

## 2014-09-21 NOTE — Progress Notes (Signed)
Utilization review completed. Aleatha Taite, RN, BSN. 

## 2014-09-21 NOTE — Progress Notes (Signed)
MD notified due to acute change in patient status. Vital signs and labs are listed below.  MD notified(1st page) Time of 1st page:  1413 Responding MD:  Dr Isidoro Donningai Time MD responded: 1413 MD response: MD stated aware, Pt recently placed on tele and given digoxin for RVR.  Vital Signs Filed Vitals:   09/21/14 0004 09/21/14 0545 09/21/14 1228 09/21/14 1351  BP: 99/70 103/65 102/58 100/60  Pulse:  77 95 111  Temp: 97.9 F (36.6 C) 98.1 F (36.7 C)  98.2 F (36.8 C)  TempSrc: Oral Oral  Oral  Resp:  22  24  Height:      Weight:  78.835 kg (173 lb 12.8 oz)    SpO2:  95%  97%     Lab Results WBC  Date/Time Value Ref Range Status  09/21/2014 04:10 AM 6.3 4.0 - 10.5 K/uL Final  09/20/2014 02:17 PM 5.7 4.0 - 10.5 K/uL Final   NEUTROPHILS RELATIVE %  Date/Time Value Ref Range Status  09/20/2014 02:17 PM 75 43 - 77 % Final   No results found for: PCO2ART No results found for: LATICACIDVEN No results found for: PCO2VEN   Duanne LimerickWesselink, Rishik Tubby Garret, RN 09/21/2014, 2:14 PM

## 2014-09-21 NOTE — Evaluation (Signed)
Physical Therapy Evaluation Patient Details Name: Jeremiah Mcmahon MRN: [id number] DOB: 10-Jun-1914 Today's Date: 09/21/2014   History of Present Illness  Pt is a 81 y.o. male, resident of ALF, with history of chronic diastolic CHF, permanent atrial fibrillation, RBBB with left anterior fascicular block, bilateral leg edema, hypotension secondary to medications, chronic respiratory failure on home oxygen 2 L/m, presented from ALF with above symptoms. Patient apparently has been gradually declining since his hospitalization middle of 2015. Due to worsening condition and lack of improvement, he presented to the ED.  Clinical Impression  Pt admitted with above diagnosis. Pt currently with functional limitations due to the deficits listed below (see PT Problem List). At the time of PT eval pt was able to perform transfers with +2 assist. Pt states he is at his baseline, however per chart review, it appears that he has had a steady functional decline since last hospitalization ~6 months ago.   Pt will benefit from skilled PT to increase their independence and safety with mobility to allow discharge to the venue listed below.  Pt and daughter agreeable to SNF at d/c with the understanding that the goal is to return to ALF. Pt's daughter states that she would like to see pt in the "rehab unit", apparently different from the "skilled care" unit at [facility].     Follow Up Recommendations SNF;Supervision/Assistance - 24 hour    Equipment Recommendations  None recommended by PT    Recommendations for Other Services       Precautions / Restrictions Precautions Precautions: Fall Restrictions Weight Bearing Restrictions: No      Mobility  Bed Mobility Overal bed mobility: Needs Assistance Bed Mobility: Rolling;Sidelying to Sit Rolling: Min assist Sidelying to sit: Mod assist       General bed mobility comments: Pt was able to transition to EOB with assist for trunk elevation. Hand-over-hand assist  for reaching and grasping bed rails required.   Transfers Overall transfer level: Needs assistance Equipment used: 2 person hand held assist Transfers: Sit to/from [employer] Sit to Stand: Mod assist;+2 physical assistance Stand pivot transfers: Mod assist;+2 physical assistance;+2 safety/equipment       General transfer comment: Pt was able to power-up to full standing position with +2 assist. Pt requires assist to maintain static standing balance at EOB prior to initiating SPT.   Ambulation/Gait             General Gait Details: Further mobility deferred due to decreased balance and apparent anxiety with taking steps.   Stairs            Wheelchair Mobility    Modified Rankin (Stroke Patients Only)       Balance Overall balance assessment: Needs assistance Sitting-balance support: Feet supported;No upper extremity supported Sitting balance-Leahy Scale: Fair     Standing balance support: Bilateral upper extremity supported Standing balance-Leahy Scale: Zero Standing balance comment: Pt required +2 assist to gain and maintain standing balance EOB. With steps, pt appeared to have a fear of falling and became anxious.                              Pertinent Vitals/Pain Pain Assessment: No/denies pain    Home Living Family/patient expects to be discharged to:: Unsure                      Prior Function Level of Independence: Independent with assistive device(s)  Comments: Pt states that he was encouraged not to ambulate by himself or without a walker, however pt states he has been walking around by himself at his apartment.      Hand Dominance   Dominant Hand: Right    Extremity/Trunk Assessment   Upper Extremity Assessment: Defer to OT evaluation           Lower Extremity Assessment: Generalized weakness      Cervical / Trunk Assessment: Normal  Communication   Communication: No difficulties   Cognition Arousal/Alertness: Lethargic Behavior During Therapy: Flat affect Overall Cognitive Status: Within Functional Limits for tasks assessed       Memory: Decreased short-term memory              General Comments      Exercises        Assessment/Plan    PT Assessment Patient needs continued PT services  PT Diagnosis Difficulty walking;Generalized weakness   PT Problem List Decreased strength;Decreased range of motion;Decreased activity tolerance;Decreased balance;Decreased mobility;Decreased knowledge of use of DME;Decreased safety awareness;Decreased knowledge of precautions  PT Treatment Interventions DME instruction;Gait training;Stair training;Functional mobility training;Therapeutic activities;Therapeutic exercise;Neuromuscular re-education;Patient/family education   PT Goals (Current goals can be found in the Care Plan section) Acute Rehab PT Goals Patient Stated Goal: Return to ALF PT Goal Formulation: With patient/family Time For Goal Achievement: 10/05/14 Potential to Achieve Goals: Good    Frequency Min 2X/week   Barriers to discharge        Co-evaluation               End of Session Equipment Utilized During Treatment: Gait belt;Oxygen Activity Tolerance: Patient limited by fatigue Patient left: in chair;with chair alarm set;with call bell/phone within reach;with nursing/sitter in room;with family/visitor present Nurse Communication: Mobility status         Time: [id number] PT Time Calculation (min) (ACUTE ONLY): 24 min   Charges:   PT Evaluation $Initial PT Evaluation Tier I: 1 Procedure PT Treatments $Therapeutic Activity: 23-37 mins   PT G Codes:        Conni SlipperKirkman, Oliver Heitzenrater 09/21/2014, 10:32 AM   Conni SlipperLaura Baylen Buckner, PT, DPT Acute Rehabilitation Services Pager: [phone]

## 2014-09-21 NOTE — Progress Notes (Signed)
[facility] PROGRESS NOTE  Jeremiah Mcmahon ZOX:[id number] DOB: 08/15/1914 DOA: 09/20/2014 PCP: Darlina GuysPOWELL, JERRY, MD  Assessment/Plan:  Healthcare associated pneumonia -Questionable aspiration pneumonia- daughter reports an episode of coughing while eating.  Patient is from ALF, and has failed an outpatient regimen of Levaquin.   -afebrile, no leukocytosis- lungs with good air movement, rhonchi in right lung field.   On 2 L [place] (chronically on 2 L [place]) -Continue IV vancomycin and Zosyn (day 2) -SLP- Dysphagia 3; Thin liquid -PT rec's-SNF- daughter states she is unsure if father will go back to ALF, as she wishes to care for her father at home.   -Continue mucolytic, supp O2 PRN  Dehydration, hypotension -Secondary to poor oral intake and diuretics -Given 250ml NS bolus -Diuretic held  Diastolic CHF -Appears dry -2D echo-EF 55-60, limited study -Hold diuretic in setting of dehydration   Acute kidney injury -Secondary to dehydration- unknown if has CKD -Creatinine 1.44, baseline unknown  Acute encephalopathy -Resolved, A &OX3  Atrial fibrillation with RVR -09/21/13 with episode of afib with RVR.  Due to soft BP, unable to resume Cardizem held.  Ordered Digoxin, will also give fluid bolus. [facility]- Called cardiology consult, may need amiodarone if continues to be in RVR  -not on anticoagulation due to advanced age/risk of fall -Continue Plavix  Acute on chronic hypoxic respiratory failure -stable, on home regimen of 2L [place]  Hypothyroidism Continue Synthroid 75 MCG daily   DVT Prophylaxis SQ Lovenox  Code Status: DNR Family Communication: Daughter, Ms. Inocencio HomesGayle, at bedside Disposition Plan: Inpatient   Consultants:  None  Procedures:  None  Antibiotics:  IV Vancomycin: 09/21/13 >>>  IV Zosyn: 09/21/13 >>>  HPI/Subjective: Jeremiah Obeylmer Tischer is a 81 yo male, resident of ALF who ambulates on wheelchair, with PMH of Afib on cardiazem, Chronic diastolic CHF (EF 81-19%55-60%), RBBB with left  anterior fasicular block, hypotension secondary to medications, chronic respiratory failure on 2l/m O2, that presented with altered mental status, decreased O2 saturations, and worsening cough and dyspnea.  History is obtained form daughter, Ms.  Inocencio HomesGayle. Pt with recent hospitalizations  Mid 2015, and has been worsenins since. Symptoms started 3 days ago, and patient has had inc O2 demands, requiring inc to 3.5L/min.  He also sustained a fall in his room 3 days ago.  In ED, pt is appears dehydrated, with soft BP, Creat 1.49, and CXR with evidence of PNA.     A &O X3, NAD.  Denies any chest pain, sob.    Objective: Filed Vitals:   09/21/14 0545  BP: 103/65  Pulse: 77  Temp: 98.1 F (36.7 C)  Resp: 22    Intake/Output Summary (Last 24 hours) at 09/21/14 1153 Last data filed at 09/21/14 1039  Gross per 24 hour  Intake    648 ml  Output    750 ml  Net   -102 ml   Filed Weights   09/20/14 1824 09/21/14 0545  Weight: 80.65 kg (177 lb 12.8 oz) 78.835 kg (173 lb 12.8 oz)    Exam:  Gen: Alert [place] male in NAD. On O2 [place] Chest: good air movement bilaterally, rhonchi in right lung field Cardiac: Regular rate and rhythm, S1-S2, no rubs murmurs or gallops  Abdomen: soft, non tender, non distended, +bowel sounds. No guarding or rigidity  Extremities: Symmetrical in appearance without cyanosis or edema  Neurological: Alert & oriented x3    Data Reviewed: Basic Metabolic Panel:  Recent Labs Lab 09/20/14 1417 09/21/14 0410  NA 139 140  K 3.7 3.9  CL 98 99  CO2 34* 35*  GLUCOSE 90 69*  BUN 22 21  CREATININE 1.49* 1.44*  CALCIUM 8.2* 8.3*   Liver Function Tests:  Recent Labs Lab 09/20/14 1417  AST 25  ALT 12  ALKPHOS 78  BILITOT 0.6  PROT 6.0  ALBUMIN 2.9*   No results for input(s): LIPASE, AMYLASE in the last 168 hours. No results for input(s): AMMONIA in the last 168 hours. CBC:  Recent Labs Lab 09/20/14 1417 09/21/14 0410  WBC 5.7 6.3  NEUTROABS 4.3  --    HGB 12.0* 12.3*  HCT 37.4* 38.3*  MCV 103.9* 107.0*  PLT 184 200   Cardiac Enzymes: No results for input(s): CKTOTAL, CKMB, CKMBINDEX, TROPONINI in the last 168 hours. BNP (last 3 results) No results for input(s): PROBNP in the last 8760 hours. CBG: No results for input(s): GLUCAP in the last 168 hours.  Recent Results (from the past 240 hour(s))  MRSA PCR Screening     Status: None   Collection Time: 09/20/14  8:32 PM  Result Value Ref Range Status   MRSA by PCR NEGATIVE NEGATIVE Final    Comment:        The GeneXpert MRSA Assay (FDA approved for NASAL specimens only), is one component of a comprehensive MRSA colonization surveillance program. It is not intended to diagnose MRSA infection nor to guide or monitor treatment for MRSA infections.      Studies: Dg Chest 2 View  09/20/2014   CLINICAL DATA:  Per EMS, pt coming from [facility] nursing facility for increased lethargy x3 days. Pt was recently diagnosed with pneumonia and has 3 days of his antibiotics left. Pt is always on O2 3 L [place] at home. Pt has hx of A-fib, COPD, chronic diastolic heart failure. Past-smoker.  EXAM: CHEST  2 VIEW  COMPARISON:  10/20/2013  FINDINGS: The heart is enlarged. There is dense opacity at the right lung base which obscures the hemidiaphragm. Right pleural effusion is present. Small left pleural effusion is present. No pulmonary edema.  IMPRESSION: 1. Cardiomegaly. 2. Right lower lobe infiltrate or atelectasis. 3. Bilateral pleural effusions, right greater than left.   Electronically Signed   By: Rosalie Gums M.D.   On: 09/20/2014 15:14    Scheduled Meds: . antiseptic oral rinse  7 mL Mouth Rinse BID  . clopidogrel  75 mg Oral Daily  . diltiazem  60 mg Oral TID  . enoxaparin (LOVENOX) injection  40 mg Subcutaneous Q24H  . lactobacillus acidophilus  1 tablet Oral BID  . [START ON 09/22/2014] levothyroxine  75 mcg Oral QAC breakfast  . loratadine  10 mg Oral Daily  . piperacillin-tazobactam  (ZOSYN)  IV  3.375 g Intravenous Q8H  . vancomycin  1,000 mg Intravenous Q24H   Continuous Infusions:   Principal Problem:   HCAP (healthcare-associated pneumonia) Active Problems:   Permanent atrial fibrillation   COPD (chronic obstructive pulmonary disease)   RBBB with left anterior fascicular block   Hypothyroid   Chronic diastolic heart failure, NYHA class 2; status post recent exacerbation in May 2015   Acute encephalopathy   Acute on chronic respiratory failure with hypoxia   Failure to thrive in adult   Acute kidney injury   Do not resuscitate    Time spent: 73    Illa Level [facility]  [facility] Pager [phone]. If 7PM-7AM, please contact night-coverage at [email or web address], password [email or web address] 09/21/2014, 11:53 AM  LOS: 1 day  I have personally examined the patient and reviewed the entire database. Agree with the above note and plan as outlined, any necessary changes made in italics. Discussed in detail with patient's daughter at bedside.  Zaylah Blecha M.D. [facility] 09/21/2014, 1:57 PM Pager: [phone]  If 7PM-7AM, please contact night-coverage [email or web address] Password [email or web address]

## 2014-09-21 NOTE — Evaluation (Signed)
Clinical/Bedside Swallow Evaluation Patient Details  Name: Koben Daman MRN: [id number] Date of Birth: [birth year]  Today's Date: 09/21/2014 Time: 1000-1040 SLP Time Calculation (min) (ACUTE ONLY): 40 min  Past Medical History:  Past Medical History  Diagnosis Date  . Atrial fibrillation     rate controlled; asymptomatic  . Swelling of ankle   . Macular degeneration   . H/O spinal stenosis     R foot drop  . COPD, mild   . Chronic diastolic heart failure, NYHA class 2     With recent exacerbation - exacerbated by A. fib RVR  . Dysrhythmia     HX OF ATRIAL FIBRILATION  . CHF (congestive heart failure)   . Depression   . Pneumonia 09/2014   Past Surgical History:  Past Surgical History  Procedure Laterality Date  . Doppler echocardiography  May 2012    Normal EF greater than 55%. Moderate left and right atria dilation; Mild MR; mild aortic sclerosis without stenosis    HPI:  Demarrio Menges is a 81 y.o. male , resident of ALF, with history of chronic diastolic CHF, permanent atrial fibrillation-too high risk for anticoagulation given advanced age and fall risk, RBBB with left anterior fascicular block, bilateral leg edema, hypotension secondary to medications, chronic respiratory failure on home oxygen 2 L/m, presented from assisted living facility with above symptoms. Patient unable to provide any history secondary to altered mental status. History obtained from patient's daughter/healthcare power of attorney Ms. Gayle at bedside. Patient apparently has been gradually declining since his hospitalization middle of 2015. Of late he's been mostly wheelchair mobile and ambulates infrequently. A week ago, patient had cough and was diagnosed with pneumonia based on chest x-ray and was started on antibiotics/levofloxacin. 3 days back patient appeared disoriented and sustained a fall in his room and was noted to have oxygen saturations in the 80s off oxygen. Since then patient has continued to  have wet sounding cough but is unable to bring up sputum, dyspnea, disorientation, excessive sleepiness, poor oral intake, progressive weakness and ongoing issues with hypoxia and his oxygen was increased to 3.5 L/m. Due to worsening condition and lack of improvement, he presented to the ED where he appears clinically dry, soft blood pressures, creatinine 1.49, chest x-ray shows right lower lobe infiltrate or atelectasis.    Assessment / Plan / Recommendation Clinical Impression  Pt does not demonstrate immediate, overt evidence of aspiration. His family does report a coughing episode at christmas, but deny chronic difficulty swallowing. Despite this, would not rule out possibility of silent aspiration. Discussed objective testing with the pt and his family, they would prefer to wait and see how he progresses acutely. SLP will follow pt and make further observations and provide education and counsel to pt and family. Family made aware of aspiration precautions and need for oral care.     Aspiration Risk  Moderate    Diet Recommendation Dysphagia 3 (Mechanical Soft);Thin liquid   Liquid Administration via: Cup;Straw Medication Administration: Whole meds with liquid Supervision: Full supervision/cueing for compensatory strategies (may need assist with self feeding, pt states his fingers are) Compensations: Slow rate;Small sips/bites Postural Changes and/or Swallow Maneuvers: Seated upright 90 degrees;Upright 30-60 min after meal    Other  Recommendations Oral Care Recommendations: Oral care BID;Other (Comment) (oral rinse after meals)   Follow Up Recommendations  Skilled Nursing facility    Frequency and Duration min 2x/week  2 weeks   Pertinent Vitals/Pain NA    SLP Swallow Goals  Swallow Study Prior Functional Status       General HPI: Erik Obeylmer Magnussen is a 81 y.o. male , resident of ALF, with history of chronic diastolic CHF, permanent atrial fibrillation-too high risk for  anticoagulation given advanced age and fall risk, RBBB with left anterior fascicular block, bilateral leg edema, hypotension secondary to medications, chronic respiratory failure on home oxygen 2 L/m, presented from assisted living facility with above symptoms. Patient unable to provide any history secondary to altered mental status. History obtained from patient's daughter/healthcare power of attorney Ms. Gayle at bedside. Patient apparently has been gradually declining since his hospitalization middle of 2015. Of late he's been mostly wheelchair mobile and ambulates infrequently. A week ago, patient had cough and was diagnosed with pneumonia based on chest x-ray and was started on antibiotics/levofloxacin. 3 days back patient appeared disoriented and sustained a fall in his room and was noted to have oxygen saturations in the 80s off oxygen. Since then patient has continued to have wet sounding cough but is unable to bring up sputum, dyspnea, disorientation, excessive sleepiness, poor oral intake, progressive weakness and ongoing issues with hypoxia and his oxygen was increased to 3.5 L/m. Due to worsening condition and lack of improvement, he presented to the ED where he appears clinically dry, soft blood pressures, creatinine 1.49, chest x-ray shows right lower lobe infiltrate or atelectasis.  Type of Study: Bedside swallow evaluation Previous Swallow Assessment: None Diet Prior to this Study: Dysphagia 3 (soft);Thin liquids Temperature Spikes Noted: No Respiratory Status: Nasal cannula History of Recent Intubation: No Behavior/Cognition: Alert;Cooperative;Pleasant mood Oral Cavity - Dentition: Dentures, top;Adequate natural dentition Self-Feeding Abilities: Needs assist Patient Positioning: Upright in chair Baseline Vocal Quality: Clear Volitional Cough: Strong;Congested Volitional Swallow: Able to elicit    Oral/Motor/Sensory Function Overall Oral Motor/Sensory Function: Appears within  functional limits for tasks assessed   Ice Chips     Thin Liquid Thin Liquid: Within functional limits    Nectar Thick Nectar Thick Liquid: Not tested   Honey Thick Honey Thick Liquid: Not tested   Puree Puree: Within functional limits   Solid   GO    Solid: Not tested      Harlon DittyBonnie Kaydin Labo, MA CCC-SLP [phone]  Sianni Cloninger, Riley NearingBonnie Caroline 09/21/2014,10:49 AM

## 2014-09-21 NOTE — Evaluation (Addendum)
Occupational Therapy Evaluation Patient Details Name: Jeremiah Mcmahon MRN: [id number] DOB: Dec 24, 1913 Today's Date: 09/21/2014    History of Present Illness Pt is a 81 y.o. male, resident of ALF, with history of chronic diastolic CHF, permanent atrial fibrillation, RBBB with left anterior fascicular block, bilateral leg edema, hypotension secondary to medications, chronic respiratory failure on home oxygen 2 L/m, presented from ALF with above symptoms. Patient apparently has been gradually declining since his hospitalization middle of 2015. Due to worsening condition and lack of improvement, he presented to the ED.   Clinical Impression   Pt admitted with above. Pt states he received assist with ADLs, PTA. Feel pt will benefit from acute OT to increase independence, strength, and activity tolerance prior to d/c. Recommending SNF for rehab. See vitals for O2 and HR.    Follow Up Recommendations  SNF;Supervision/Assistance - 24 hour    Equipment Recommendations  Other (comment) (AE)    Recommendations for Other Services       Precautions / Restrictions Precautions Precautions: Fall Restrictions Weight Bearing Restrictions: No      Mobility Bed Mobility       General bed mobility comments: not assessed  Transfers Overall transfer level: Needs assistance Equipment used: Rolling walker (2 wheeled) Transfers: Sit to/from [employer]GI CorporationStand;Stand Pivot Transfers Sit to Stand: Mod assist;Min assist Stand pivot transfers: Mod assist;Min assist       General transfer comment: cues for technique.          ADL Overall ADL's : Needs assistance/impaired     Grooming: Set up;Supervision/safety;Sitting   Upper Body Bathing: Set up;Supervision/ safety;Sitting   Lower Body Bathing: Sit to/from stand;Maximal assistance   Upper Body Dressing : Set up;Supervision/safety;Sitting   Lower Body Dressing: Maximal assistance;Sit to/from stand   Toilet Transfer: Moderate  assistance;RW;Stand-pivot;BSC;Minimal assistance   Toileting- Clothing Manipulation and Hygiene: Maximal assistance;Sit to/from stand       Functional mobility during ADLs: Moderate assistance;Rolling walker;Minimal assistance (stand pivot) General ADL Comments: Educated on AE and pt practiced with sockaid. Mentioned where they could purchase if wanted. Educated on energy conservation techniques (also see vitals). Educated on alternative techniques for LB ADLs (leaning,rolling, standing). Daughter asking about cane and OT explained that it is a lot less support and therapy maybe could progress to that. Educated on safety such as safe shoewear, use of bag/basket on walker. Discussed d/c plans/recommendations. Educated on deep breathing technique. Educated on 3 in 1.     Vision                     Perception     Praxis      Pertinent Vitals/Pain Pain Assessment: No/denies pain; Pt HR jumping to above 200 during session at times and O2 dropping to 70's-80's on a little over 2L of O2. Bumped pt up to 3L and educated on deep breathing technique. O2 trended up. Notified nurse of O2 and HR.      Hand Dominance Right   Extremity/Trunk Assessment Upper Extremity Assessment Upper Extremity Assessment: Generalized weakness (reports pain with shoulder flexion at times)    Lower Extremity Assessment Lower Extremity Assessment: Defer to PT evaluation   Cervical / Trunk Assessment Cervical / Trunk Assessment: Normal   Communication Communication Communication: HOH   Cognition Arousal/Alertness: Awake/alert Behavior During Therapy: Flat affect Overall Cognitive Status:  (unsure of baseline; stated wrong year)                   General Comments  Exercises Exercises: Other exercises Other Exercises Other Exercises: instructed pt to be moving arms   Shoulder Instructions      Home Living Family/patient expects to be discharged to:: Unsure (open to rehab; from  assisted living)                             Home Equipment: Walker - 4 wheels          Prior Functioning/Environment Level of Independence: Needs assistance    ADL's / Homemaking Assistance Needed: assist with bathing and dressing   Comments: Pt states that he was encouraged not to ambulate by himself or without a walker, however pt states he has been walking around by himself at his apartment.     OT Diagnosis: Generalized weakness   OT Problem List: Decreased strength;Decreased range of motion;Impaired balance (sitting and/or standing);Decreased activity tolerance;Decreased knowledge of use of DME or AE;Decreased knowledge of precautions;Decreased safety awareness;Pain   OT Treatment/Interventions: Self-care/ADL training;Therapeutic exercise;DME and/or AE instruction;Energy conservation;Therapeutic activities;Cognitive remediation/compensation;Patient/family education;Balance training    OT Goals(Current goals can be found in the care plan section) Acute Rehab OT Goals Patient Stated Goal: not stated OT Goal Formulation: With patient/family Time For Goal Achievement: 09/28/14 Potential to Achieve Goals: Good ADL Goals Pt Will Perform Lower Body Bathing: sit to/from stand;with adaptive equipment;with min assist Pt Will Perform Lower Body Dressing: with adaptive equipment;sit to/from stand;with min assist Pt Will Transfer to Toilet: with min assist;ambulating;bedside commode Pt Will Perform Toileting - Clothing Manipulation and hygiene: with min assist;sit to/from stand  OT Frequency: Min 2X/week   Barriers to D/C:            Co-evaluation              End of Session Equipment Utilized During Treatment: Rolling walker;Gait belt;Oxygen Nurse Communication: Other (comment) (IV leaking blood; HR and O2)  Activity Tolerance: Patient tolerated treatment well Patient left: in chair;with call bell/phone within reach;with family/visitor present   Time:  1100-1139 OT Time Calculation (min): 39 min Charges:  OT General Charges $OT Visit: 1 Procedure OT Evaluation $Initial OT Evaluation Tier I: 1 Procedure OT Treatments $Self Care/Home Management : 8-22 mins G-CodesEarlie Raveling OTR/L [phone] 09/21/2014, 12:41 PM

## 2014-09-22 ENCOUNTER — Inpatient Hospital Stay (HOSPITAL_COMMUNITY): Payer: Medicare Other

## 2014-09-22 DIAGNOSIS — Z515 Encounter for palliative care: Secondary | ICD-10-CM | POA: Insufficient documentation

## 2014-09-22 DIAGNOSIS — J9621 Acute and chronic respiratory failure with hypoxia: Secondary | ICD-10-CM

## 2014-09-22 DIAGNOSIS — R630 Anorexia: Secondary | ICD-10-CM | POA: Insufficient documentation

## 2014-09-22 LAB — CBC
HEMATOCRIT: 36.1 % — AB (ref 39.0–52.0)
Hemoglobin: 11.9 g/dL — ABNORMAL LOW (ref 13.0–17.0)
MCH: 35 pg — ABNORMAL HIGH (ref 26.0–34.0)
MCHC: 33 g/dL (ref 30.0–36.0)
MCV: 106.2 fL — AB (ref 78.0–100.0)
Platelets: 179 10*3/uL (ref 150–400)
RBC: 3.4 MIL/uL — ABNORMAL LOW (ref 4.22–5.81)
RDW: 13.9 % (ref 11.5–15.5)
WBC: 8.1 10*3/uL (ref 4.0–10.5)

## 2014-09-22 LAB — BASIC METABOLIC PANEL
Anion gap: 7 (ref 5–15)
BUN: 28 mg/dL — AB (ref 6–23)
CHLORIDE: 101 meq/L (ref 96–112)
CO2: 32 mmol/L (ref 19–32)
Calcium: 8.1 mg/dL — ABNORMAL LOW (ref 8.4–10.5)
Creatinine, Ser: 1.58 mg/dL — ABNORMAL HIGH (ref 0.50–1.35)
GFR calc Af Amer: 40 mL/min — ABNORMAL LOW (ref >90)
GFR calc non Af Amer: 35 mL/min — ABNORMAL LOW (ref >90)
Glucose, Bld: 106 mg/dL — ABNORMAL HIGH (ref 70–99)
Potassium: 4.1 mmol/L (ref 3.5–5.1)
Sodium: 140 mmol/L (ref 135–145)

## 2014-09-22 LAB — TROPONIN I: TROPONIN I: 0.06 ng/mL — AB (ref <0.031)

## 2014-09-22 LAB — CK TOTAL AND CKMB (NOT AT ARMC)
CK, MB: 2.2 ng/mL (ref 0.3–4.0)
RELATIVE INDEX: 1.6 (ref 0.0–2.5)
Total CK: 138 U/L (ref 7–232)

## 2014-09-22 IMAGING — RF DG SWALLOWING FUNCTION - NRPT [facility]
1 series · 18 of 24 positions shown · non-contrast
Comparison: none

[Series 1: run · 19 acquisitions, 18 frames shown]
[im 1/19]
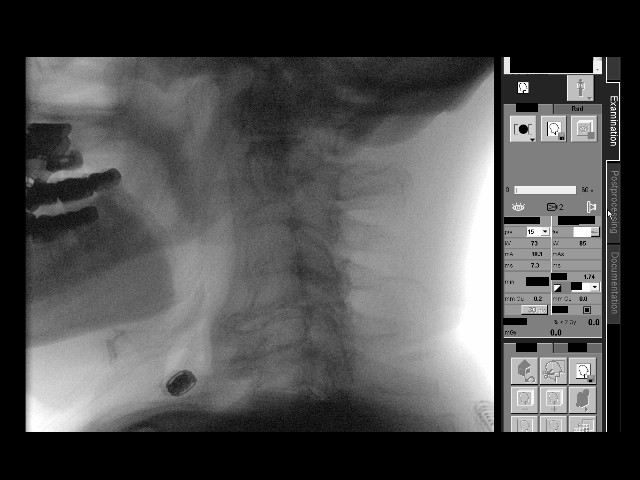
[im 2/19]
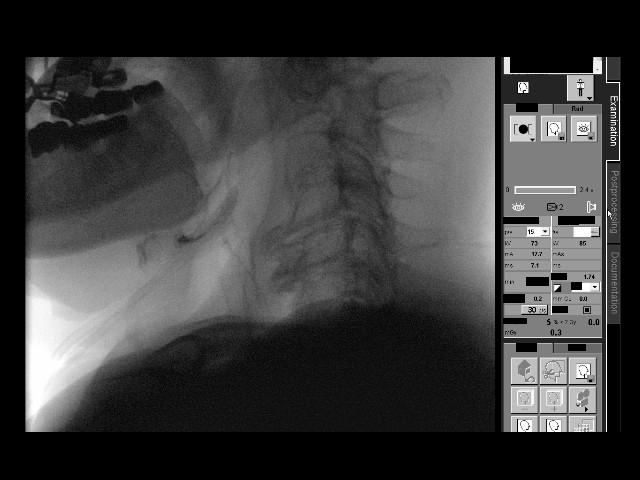
[im 3/19]
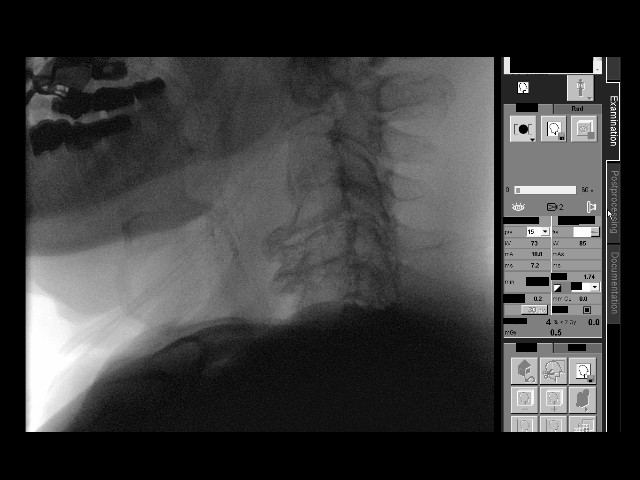
[im 4/19]
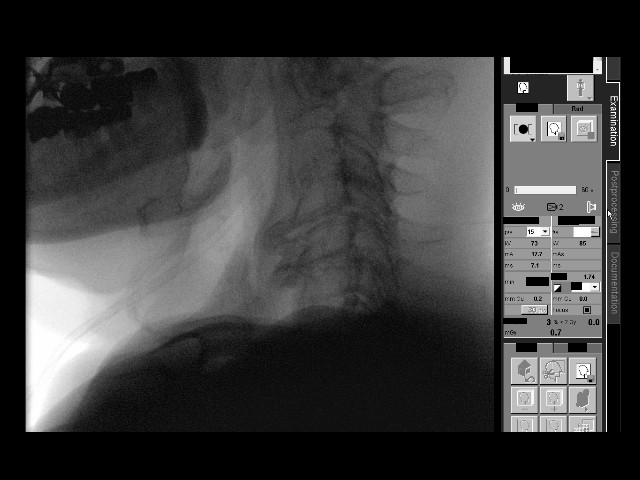
[im 6/19]
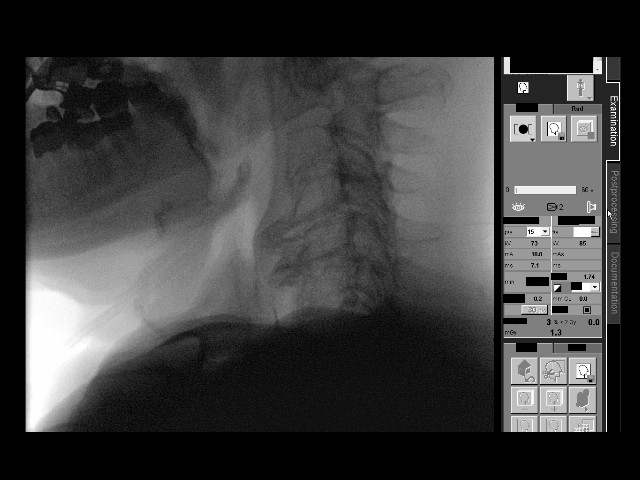
[im 6/19]
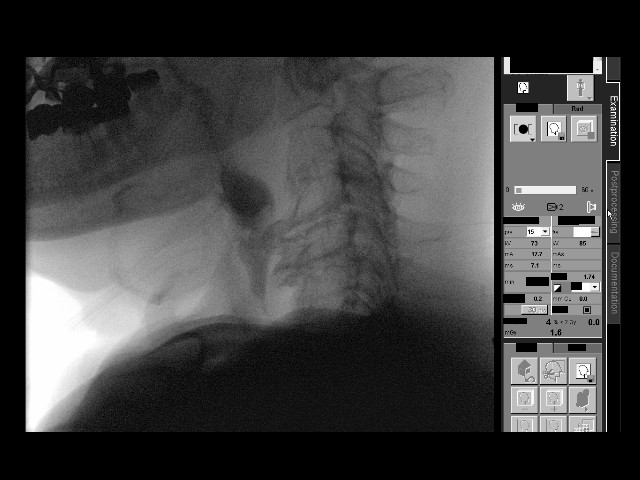
[im 7/19]
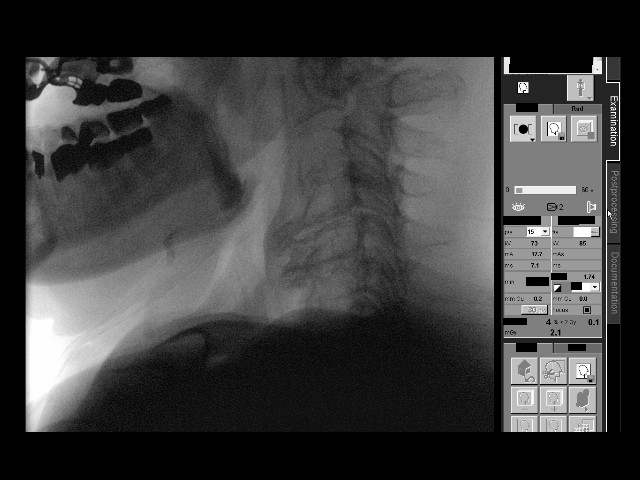
[im 9/19]
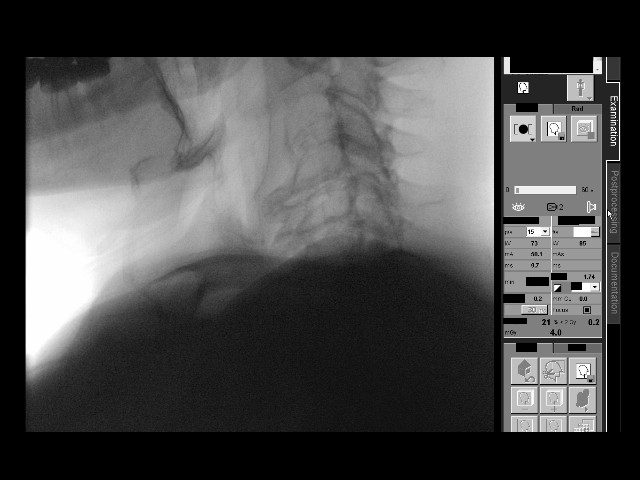
[im 10/19]
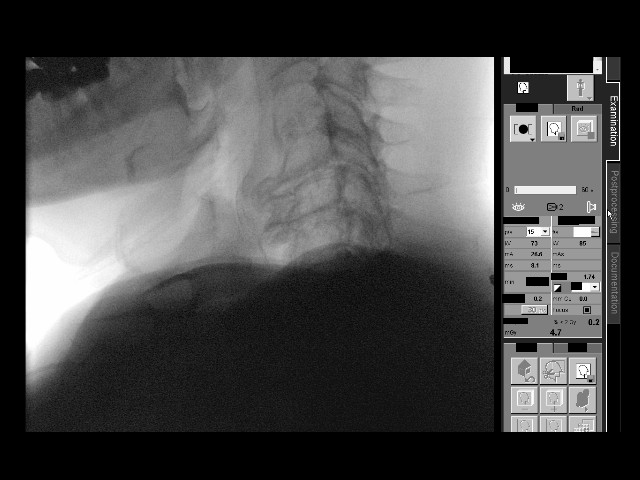
[im 10/19]
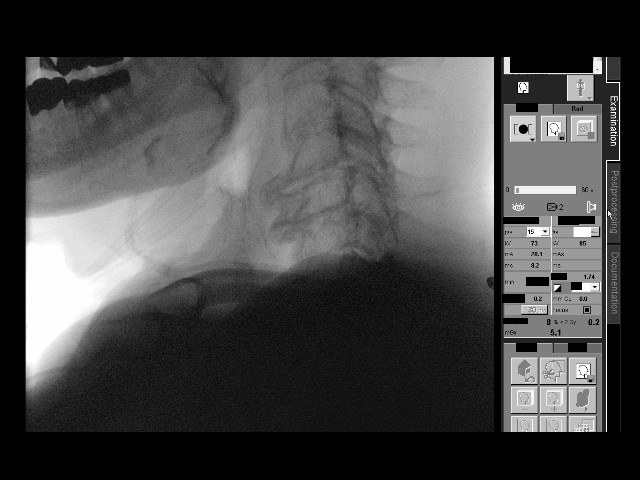
[im 12/19]
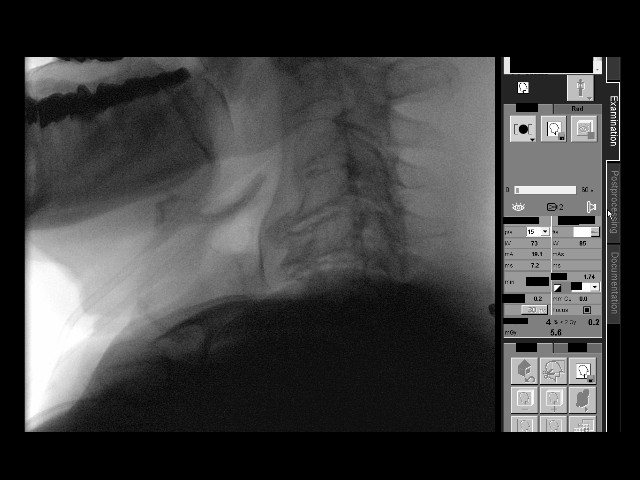
[im 13/19]
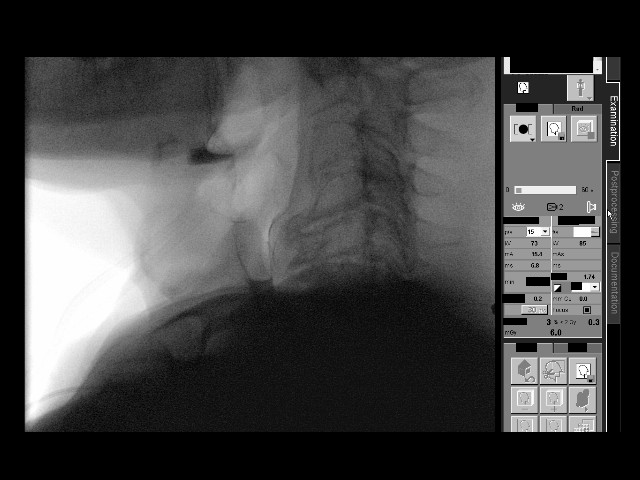
[im 14/19]
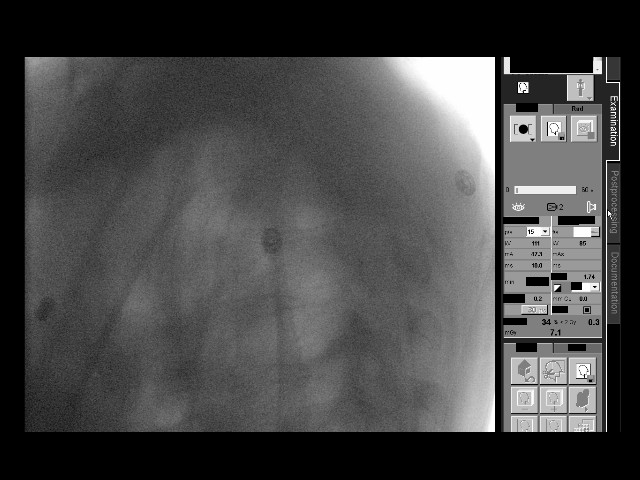
[im 15/19]
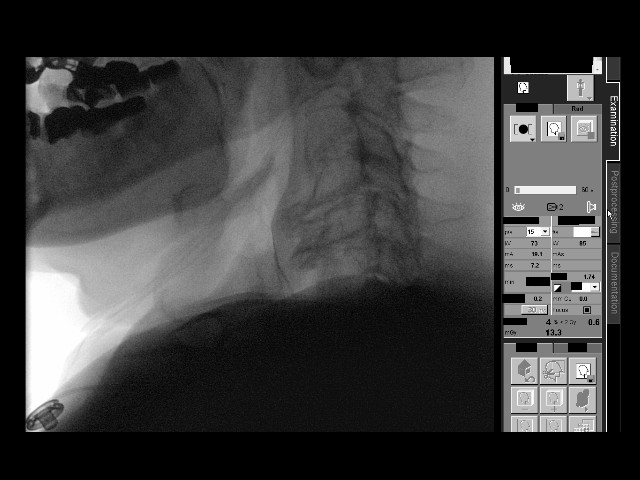
[im 16/19]
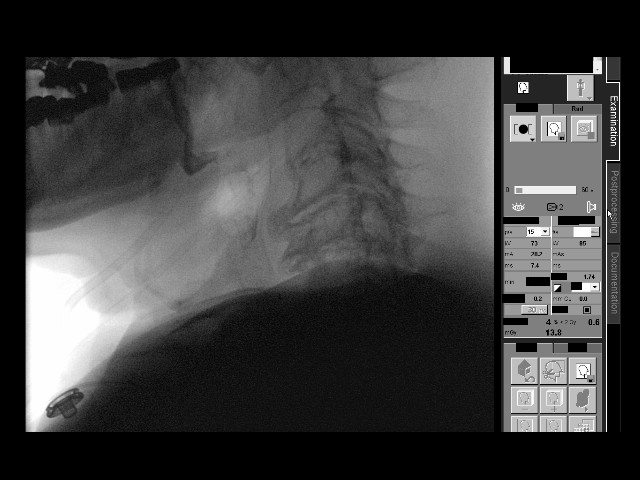
[im 17/19]
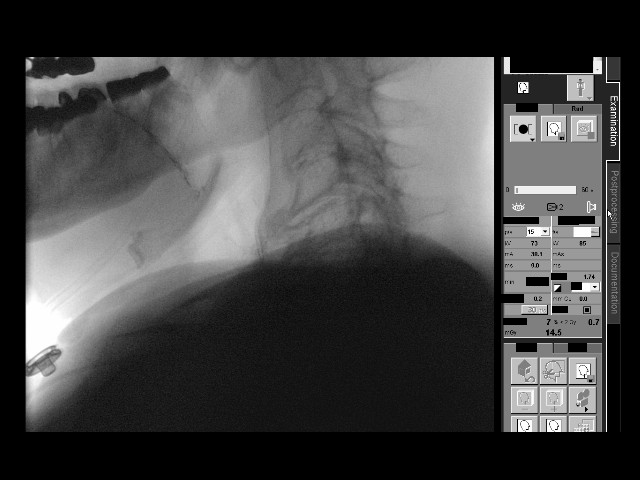
[im 19/19]
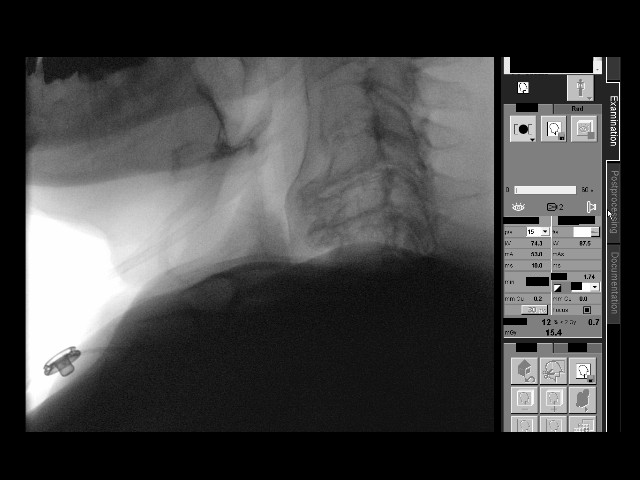
[im 19/19]
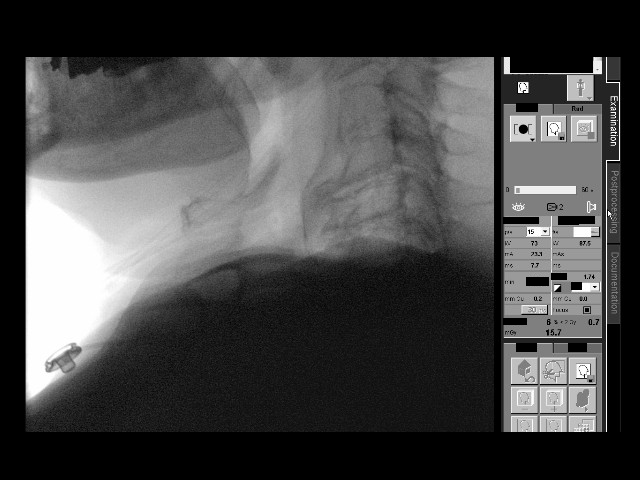

[18 of 24 positions shown; findings below may reference images not displayed]

Canned report from images found in remote index.

Refer to host system for actual result text.

## 2014-09-22 MED ORDER — METOPROLOL TARTRATE 12.5 MG HALF TABLET
12.5000 mg | ORAL_TABLET | Freq: Two times a day (BID) | ORAL | Status: DC
  Administered 2014-09-22 – 2014-09-24 (×4): 12.5 mg via ORAL
  Filled 2014-09-22 (×6): qty 1

## 2014-09-22 MED ORDER — METOPROLOL TARTRATE 1 MG/ML IV SOLN
5.0000 mg | Freq: Three times a day (TID) | INTRAVENOUS | Status: DC
  Filled 2014-09-22 (×3): qty 5

## 2014-09-22 MED ORDER — FUROSEMIDE 40 MG PO TABS
40.0000 mg | ORAL_TABLET | Freq: Every day | ORAL | Status: DC
  Administered 2014-09-22: 40 mg via ORAL
  Filled 2014-09-22 (×3): qty 1

## 2014-09-22 MED ORDER — BACID PO TABS
1.0000 | ORAL_TABLET | Freq: Two times a day (BID) | ORAL | Status: DC
  Administered 2014-09-22 – 2014-09-24 (×6): 1 via ORAL
  Filled 2014-09-22 (×7): qty 1

## 2014-09-22 MED ORDER — BOOST PLUS PO LIQD
237.0000 mL | Freq: Three times a day (TID) | ORAL | Status: DC
  Administered 2014-09-22 – 2014-09-24 (×4): 237 mL via ORAL
  Filled 2014-09-22 (×14): qty 237

## 2014-09-22 NOTE — Clinical Social Work Note (Signed)
Clinical[facility] fax[facility].    Roddie McBryant [place] Vanover MSW, [place]CSWA, [place], [phone]

## 2014-09-22 NOTE — Progress Notes (Signed)
Spoke with Jeremiah Mcmahon from pharmacy about Bacid chewable. Med will not scan as the appropriate medication. Verified med with pharmacy, and med was given. Will continue to monitor.

## 2014-09-22 NOTE — Procedures (Signed)
Objective Swallowing Evaluation: Modified Barium Swallowing Study  Patient Details  Name: Jeremiah Mcmahon MRN: [id number] Date of Birth: [birth year]  Today's Date: 09/22/2014 Time: [id number] SLP Time Calculation (min) (ACUTE ONLY): 25 min  Past Medical History:  Past Medical History  Diagnosis Date  . Atrial fibrillation     rate controlled; asymptomatic  . Swelling of ankle   . Macular degeneration   . H/O spinal stenosis     R foot drop  . COPD, mild   . Chronic diastolic heart failure, NYHA class 2     With recent exacerbation - exacerbated by A. fib RVR  . Dysrhythmia     HX OF ATRIAL FIBRILATION  . CHF (congestive heart failure)   . Depression   . Pneumonia 09/2014   Past Surgical History:  Past Surgical History  Procedure Laterality Date  . Doppler echocardiography  May 2012    Normal EF greater than 55%. Moderate left and right atria dilation; Mild MR; mild aortic sclerosis without stenosis    HPI:  Jeremiah Mcmahon is a 81 y.o. male , resident of ALF, with history of chronic diastolic CHF, permanent atrial fibrillation-too high risk for anticoagulation given advanced age and fall risk, RBBB with left anterior fascicular block, bilateral leg edema, hypotension secondary to medications, chronic respiratory failure on home oxygen 2 L/m, presented from assisted living facility with above symptoms. Patient unable to provide any history secondary to altered mental status. History obtained from patient's daughter/healthcare power of attorney Ms. Gayle at bedside. Patient apparently has been gradually declining since his hospitalization middle of 2015. Of late he's been mostly wheelchair mobile and ambulates infrequently. A week ago, patient had cough and was diagnosed with pneumonia based on chest x-ray and was started on antibiotics/levofloxacin. 3 days back patient appeared disoriented and sustained a fall in his room and was noted to have oxygen saturations in the 80s off oxygen. Since  then patient has continued to have wet sounding cough but is unable to bring up sputum, dyspnea, disorientation, excessive sleepiness, poor oral intake, progressive weakness and ongoing issues with hypoxia and his oxygen was increased to 3.5 L/m. Due to worsening condition and lack of improvement, he presented to the ED where he appears clinically dry, soft blood pressures, creatinine 1.49, chest x-ray shows right lower lobe infiltrate or atelectasis.      Assessment / Plan / Recommendation Clinical Impression  Dysphagia Diagnosis: Mild oral phase dysphagia;Mild pharyngeal phase dysphagia Clinical impression: Patient presents with mild oral and pharyngeal impairments that impact swallow function.  Soft solids resulted in prolonged, but functional mastication.  Thin liquids prematurely spilled to the level of the vallecula or pyriform sinus depending on size of bolus and when patient was not attending to bolus i.e. swishing liquids he would aspirate thin liquids with delayed sensation.  Patient's strong cough was effective at removing trace penetrates/aspirates.  Therefore recommend to continue with current diet recommendations with full supervision to ensure limited distractions, and small sips.  SLP to follow up briefly for education with patient and family.     Treatment Recommendation  Therapy as outlined in treatment plan below    Diet Recommendation Dysphagia 3 (Mechanical Soft);Thin liquid   Liquid Administration via: Cup;Straw Medication Administration: Whole meds with puree Supervision: Full supervision/cueing for compensatory strategies Compensations: Slow rate;Small sips/bites Postural Changes and/or Swallow Maneuvers: Seated upright 90 degrees;Upright 30-60 min after meal    Other  Recommendations Oral Care Recommendations: Oral care BID   Follow Up Recommendations  Skilled Nursing facility;24 hour supervision/assistance    Frequency and Duration min 2x/week  2 weeks   Pertinent  Vitals/Pain Patient lethargic during procedure    SLP Swallow Goals  See care plan    General HPI: Jeremiah Mcmahon is a 81 y.o. male , resident of ALF, with history of chronic diastolic CHF, permanent atrial fibrillation-too high risk for anticoagulation given advanced age and fall risk, RBBB with left anterior fascicular block, bilateral leg edema, hypotension secondary to medications, chronic respiratory failure on home oxygen 2 L/m, presented from assisted living facility with above symptoms. Patient unable to provide any history secondary to altered mental status. History obtained from patient's daughter/healthcare power of attorney Ms. Gayle at bedside. Patient apparently has been gradually declining since his hospitalization middle of 2015. Of late he's been mostly wheelchair mobile and ambulates infrequently. A week ago, patient had cough and was diagnosed with pneumonia based on chest x-ray and was started on antibiotics/levofloxacin. 3 days back patient appeared disoriented and sustained a fall in his room and was noted to have oxygen saturations in the 80s off oxygen. Since then patient has continued to have wet sounding cough but is unable to bring up sputum, dyspnea, disorientation, excessive sleepiness, poor oral intake, progressive weakness and ongoing issues with hypoxia and his oxygen was increased to 3.5 L/m. Due to worsening condition and lack of improvement, he presented to the ED where he appears clinically dry, soft blood pressures, creatinine 1.49, chest x-ray shows right lower lobe infiltrate or atelectasis.  Type of Study: Modified Barium Swallowing Study Reason for Referral: Objectively evaluate swallowing function Previous Swallow Assessment: None Diet Prior to this Study: Dysphagia 3 (soft);Thin liquids Temperature Spikes Noted: No Respiratory Status: Nasal cannula History of Recent Intubation: No Behavior/Cognition: Cooperative;Lethargic;Requires cueing Oral Cavity -  Dentition: Dentures, top;Adequate natural dentition Oral Motor / Sensory Function: Within functional limits Self-Feeding Abilities: Needs assist;Able to feed self Patient Positioning: Upright in chair Baseline Vocal Quality: Clear Volitional Cough: Strong;Congested Volitional Swallow: Able to elicit Anatomy: Within functional limits Pharyngeal Secretions: Not observed secondary MBS    Reason for Referral Objectively evaluate swallowing function   Oral Phase Oral Preparation/Oral Phase Oral Phase: Impaired Oral - Solids Oral - Puree: Within functional limits Oral - Mechanical Soft: Impaired mastication;Other (Comment) (prolonged but functional )   Pharyngeal Phase Pharyngeal Phase Pharyngeal Phase: Impaired Pharyngeal - Thin Pharyngeal - Thin Cup: Delayed swallow initiation;Premature spillage to valleculae;Premature spillage to pyriform sinuses;Penetration/Aspiration before swallow;Trace aspiration Penetration/Aspiration details (thin cup): Material enters airway, passes BELOW cords then ejected out Pharyngeal - Thin Straw: Within functional limits Pharyngeal Phase - Comment Pharyngeal Comment: attention and lethergy appered to impact function  Cervical Esophageal Phase    GO   Fae Pippin, M.A., CCC-SLP [phone]  Cervical Esophageal Phase Cervical Esophageal Phase: [facility]         Vihana Kydd 09/22/2014, 4:31 PM

## 2014-09-22 NOTE — Clinical Social Work Psychosocial (Signed)
Clinical Social Work Department BRIEF PSYCHOSOCIAL ASSESSMENT 09/22/2014  Patient:  Erik ObeyBILLMAN,Klaus     Account Number:  [id number]     Admit date:  09/20/2014  Clinical Social Worker:  Lavell LusterAMPBELL,Ronnie Mallette BRYANT, LCSWA  Date/Time:  09/22/2014 10:12 AM  Referred by:  Physician  Date Referred:  09/22/2014 Referred for  SNF Placement   Other Referral:   NA   Interview type:  Family Other interview type:   Patient's daughter interviewed to complete assessment.    PSYCHOSOCIAL DATA Living Status:  FACILITY [facility] Level of care:  Assisted Living Primary support name:  Kennon RoundsSally Primary support relationship to patient:  CHILD, ADULT Degree of support available:   Support is strong.    CURRENT CONCERNS Current Concerns  Post-Acute Placement   Other Concerns:   NA    SOCIAL WORK ASSESSMENT / PLAN CSW spoke with patient's daughter to complete assessment. [facility], but daughter agrees that patient needs SNF at discharge and would like for the [facility] before transitioning back to ALF level of care. CSW explained SNF search/placement process and answered questions. Daughter Kennon RoundsSally appears calm, supportive of patient, and engaged in assessment. CSW will assist.   Assessment/plan status:  Psychosocial Support/Ongoing Assessment of Needs Other assessment/ plan:   Complete Fl2, Fax, PASRR   Information/referral to community resources:   CSW contact information given.    PATIENT'S/FAMILY'S RESPONSE TO PLAN OF CARE: Patient's daughter plans for the [facility] at SNF level of care. CSW will assist.       Roddie McBryant Matha Masse MSW, [place]CSWA, [place]ASA, [phone]

## 2014-09-22 NOTE — Progress Notes (Addendum)
INITIAL NUTRITION ASSESSMENT  DOCUMENTATION CODES Per approved criteria  -Not Applicable   INTERVENTION: -Continue Boost Plus TID  NUTRITION DIAGNOSIS: Predicted suboptimal nutrient intake related to decline in status as evidenced by PO: 50%, likely transition to hospice.   Goal: Pt will meet >90% of estimated nutritional needs  Monitor:  PO/supplement intake, labs, weight changes, I/O's, goals of care  Reason for Assessment: Consult to assess needs  81 y.o. male  Admitting Dx: HCAP (healthcare-associated pneumonia)  Patient is a 81 year old male resident of ALF, chronic diastolic CHF, permanent A. fib, too high risk for antibiotic evaluation given advanced age and fall risk, chronic respiratory failure presented from assisted living facility with altered mental status, hypoxia, cough and dyspnea. Chest x-ray showed right lower lobe infiltrates. Patient was admitted for further workup.  ASSESSMENT: Pt admitted with HCAP. He is a resident of [facility].  RD received consult to assess pt needs. Pt would not arouse to name being called at time of visit. No family present.  Intake has been fair during admission; PO: 50%. Pt underwent speech evaluation today which revealed mild dysphagia. Speech recommended dysphagia 3 diet with thin liquids, which it pt's current diet order. Noted container of Boost Plus at pt bedside.  Wt ranges from 180-190#, likely due to hx of CHF and diuretic use. Weight changes are not significant for time frames given. Limited nutrition-focused physical exam revealed no signs of fat or muscle depletion. Palliative care following. Per MD notes, pt family would like to return to facility with hospice services once pt is medically stable for discharge.  Labs reviewed. BUN/Creat: 28/1.58, Calcium: 8.1, Glucose: 106.  Height: Ht Readings from Last 1 Encounters:  09/20/14  (1.753 m)    Weight: Wt Readings from Last 1 Encounters:  09/22/14 183 lb 9.6 oz  (83.28 kg)    Ideal Body Weight: 160#  % Ideal Body Weight: 114%  Wt Readings from Last 10 Encounters:  09/22/14 183 lb 9.6 oz (83.28 kg)  08/02/14 187 lb 4.8 oz (84.959 kg)  05/27/14 179 lb 12.8 oz (81.557 kg)  03/29/14 203 lb (92.08 kg)  10/20/13 192 lb 1.6 oz (87.136 kg)  09/30/13 190 lb (86.183 kg)  03/25/13 194 lb 11.2 oz (88.315 kg)  07/03/12 182 lb (82.555 kg)    Usual Body Weight: 190#  % Usual Body Weight: 96%  BMI:  Body mass index is 27.1 kg/(m^2). Overweight  Estimated Nutritional Needs: Kcal: 1800-2000 Protein: 76-86 grams Fluid: 1.8-2.0 L  Skin: scab under rt eye  Diet Order: DIET DYS 3  EDUCATION NEEDS: -Education not appropriate at this time   Intake/Output Summary (Last 24 hours) at 09/22/14 1543 Last data filed at 09/22/14 1435  Gross per 24 hour  Intake 2021.3 ml  Output    500 ml  Net 1521.3 ml    Last BM: PTA  Labs:   Recent Labs Lab 09/20/14 1417 09/21/14 0410 09/22/14 0019  NA 139 140 140  K 3.7 3.9 4.1  CL 98 99 101  CO2 34* 35* 32  BUN 22 21 28*  CREATININE 1.49* 1.44* 1.58*  CALCIUM 8.2* 8.3* 8.1*  GLUCOSE 90 69* 106*    CBG (last 3)  No results for input(s): GLUCAP in the last 72 hours.  Scheduled Meds: . antiseptic oral rinse  7 mL Mouth Rinse BID  . clopidogrel  75 mg Oral Daily  . enoxaparin (LOVENOX) injection  30 mg Subcutaneous Q24H  . furosemide  40 mg Oral Daily  .  lactobacillus acidophilus  1 tablet Oral BID  . lactose free nutrition  237 mL Oral TID WC  . levothyroxine  75 mcg Oral QAC breakfast  . loratadine  10 mg Oral Daily  . metoprolol tartrate  12.5 mg Oral BID  . piperacillin-tazobactam (ZOSYN)  IV  3.375 g Intravenous Q8H  . vancomycin  1,000 mg Intravenous Q24H    Continuous Infusions:   Past Medical History  Diagnosis Date  . Atrial fibrillation     rate controlled; asymptomatic  . Swelling of ankle   . Macular degeneration   . H/O spinal stenosis     R foot drop  . COPD, mild    . Chronic diastolic heart failure, NYHA class 2     With recent exacerbation - exacerbated by A. fib RVR  . Dysrhythmia     HX OF ATRIAL FIBRILATION  . CHF (congestive heart failure)   . Depression   . Pneumonia 09/2014    Past Surgical History  Procedure Laterality Date  . Doppler echocardiography  May 2012    Normal EF greater than 55%. Moderate left and right atria dilation; Mild MR; mild aortic sclerosis without stenosis     Michayla Mcneil A. Mayford KnifeWilliams, RD, LDN, CDE Pager: [phone] After hours Pager: [phone]

## 2014-09-22 NOTE — Progress Notes (Signed)
Patient had not voided prior shift, bladder scan read 733 ml, patient denied feeling the urge to void and/or pressure on the bladder, performed in and out cath, drained 800 ml of urine, patient tolerated well.  Will continue to monitor. Diamantina MonksARPENTER,Aarya Robinson, RN

## 2014-09-22 NOTE — Progress Notes (Signed)
Speech Language Pathology Treatment: Dysphagia  Patient Details Name: Jeremiah Mcmahon MRN: [id number] DOB: March 27, 1914 Today's Date: 09/22/2014 Time: 0945-1000 SLP Time Calculation (min) (ACUTE ONLY): 15 min  Assessment / Plan / Recommendation Clinical Impression  Pt presents with convincing evidence of aspiration including delayed hard cough following sips of thin liquids and family report that pt began coughing mid meal, but was not coughing prior. Discussed findings with dtr who is open to MBS to determine if diet modification or strategies may decrease risk, though discussion will include pt preference and quality of life. MD and dtr discussing possibility of palliative care referral, which will be helpful in this case.    HPI HPI: Jeremiah Mcmahon is a 81 y.o. male , resident of ALF, with history of chronic diastolic CHF, permanent atrial fibrillation-too high risk for anticoagulation given advanced age and fall risk, RBBB with left anterior fascicular block, bilateral leg edema, hypotension secondary to medications, chronic respiratory failure on home oxygen 2 L/m, presented from assisted living facility with above symptoms. Patient unable to provide any history secondary to altered mental status. History obtained from patient's daughter/healthcare power of attorney Jeremiah Mcmahon at bedside. Patient apparently has been gradually declining since his hospitalization middle of 2015. Of late he's been mostly wheelchair mobile and ambulates infrequently. A week ago, patient had cough and was diagnosed with pneumonia based on chest x-ray and was started on antibiotics/levofloxacin. 3 days back patient appeared disoriented and sustained a fall in his room and was noted to have oxygen saturations in the 80s off oxygen. Since then patient has continued to have wet sounding cough but is unable to bring up sputum, dyspnea, disorientation, excessive sleepiness, poor oral intake, progressive weakness and ongoing issues with  hypoxia and his oxygen was increased to 3.5 L/m. Due to worsening condition and lack of improvement, he presented to the ED where he appears clinically dry, soft blood pressures, creatinine 1.49, chest x-ray shows right lower lobe infiltrate or atelectasis.    Pertinent Vitals Pain Assessment: No/denies pain  SLP Plan  MBS    Recommendations Diet recommendations: Dysphagia 3 (mechanical soft);Thin liquid Liquids provided via: Cup;Straw Medication Administration: Whole meds with liquid Supervision: Full supervision/cueing for compensatory strategies Compensations: Slow rate;Small sips/bites Postural Changes and/or Swallow Maneuvers: Seated upright 90 degrees;Upright 30-60 min after meal              Oral Care Recommendations: Oral care BID;Other (Comment) Follow up Recommendations: Skilled Nursing facility Plan: MBS    GO    Harlon DittyBonnie Wyley Hack, KentuckyMA CCC-SLP [phone]  Jeremiah Mcmahon, Jeremiah Mcmahon 09/22/2014, 10:46 AM

## 2014-09-22 NOTE — Progress Notes (Signed)
[facility] PROGRESS NOTE  Jeremiah Mcmahon ZOX:[id number] DOB: [birth year] DOA: 09/20/2014 PCP: Darlina Guys, MD  Brief history of present illness  Patient is a 81 year old male resident of ALF, chronic diastolic CHF, permanent A. fib, too high risk for antibiotic evaluation given advanced age and fall risk, chronic respiratory failure presented from assisted living facility with altered mental status, hypoxia, cough and dyspnea. Chest x-ray showed right lower lobe infiltrates. Patient was admitted for further workup.  Assessment/Plan:  Healthcare associated pneumonia: Likely aspiration - Most likely aspiration, patient was evaluated by speech therapy in my presence, was having aspiration, recommended dysphagia 3 diet with thin liquids with aspiration precautions to be continued at facility. - Patient is from ALF, and has failed an outpatient regimen of Levaquin.   -Remains afebrile, no leukocytosis- lungs with good air movement bilaterally. On 2 L [place] (chronically on 2 L [place]) -Continue IV vancomycin and Zosyn (day 3) -SLP- Dysphagia 3; Thin liquid -Continue mucolytic, supp O2 PRN  Dehydration, hypotension -BP remains soft- pt with chronic hypotension, likely worsened by poor oral intake -Given NS bolus.  -Per cardiology- restart Lasix  qd, will not further hydrate  Diastolic CHF -Appears dry -2D echo-EF 55-60, limited study -Per Cards Lasix  PO daily restarted   Acute kidney injury -Secondary to dehydration- unknown if has CKD -Creatinine 1.58, baseline unknown  Acute encephalopathy -Resolved, A &OX3  Atrial fibrillation with RVR -Rate controlled. -09/21/13- with episode of afib with RVR, due to soft BP, cardiazem held. Given one time dose Digoxin and  fluid bolus -Cards on board, rec's- transitioned to oral Metoprolol 12.5 po BID -not on anticoagulation due to advanced age/risk of fall -Continue Plavix  Acute on chronic hypoxic respiratory failure -Stable, on  home regimen of 2L [place]  Hypothyroidism -Continue Synthroid 75 MCG daily   DVT Prophylaxis SQ Lovenox  Code Status: DNR Family Communication: Daughter at bedside  Disposition Plan: Discussed in detail with the patient's daughter, she lives in a third floor apartment, which is not feasible for the patient at this time. Plan would be to return back to [facility] assisted living facility with home health PT. She is open to the idea of hospice/palliative following the patient at the facility. I called in the palliative consult today.   tants:  None  Procedures:  None  Antibiotics:  IV Vancomycin: 09/21/13 >>>  IV Zosyn: 09/21/13 >>>  HPI/Subjective: Jeremiah Mcmahon is a 81 yo male, resident of ALF who ambulates on wheelchair, with PMH of Afib on cardiazem, Chronic diastolic CHF (EF 81-19%), RBBB with left anterior fasicular block, hypotension secondary to medications, chronic respiratory failure on 2l/m O2, that presented with altered mental status, decreased O2 saturations, and worsening cough and dyspnea.  History is obtained form daughter, Ms.  Inocencio Homes. Pt with recent hospitalizations  Mid 2015, and has been worsenins since. Symptoms started 3 days ago, and patient has had inc O2 demands, requiring inc to 3.5L/min.  He also sustained a fall in his room 3 days ago.  In ED, pt is appears dehydrated, with soft BP, Creat 1.49, and CXR with evidence of PNA.     A &O X3, NAD.  No complaints, denies any chest pain, sob.    Objective: Filed Vitals:   09/22/14 0824  BP: 104/68  Pulse: 91  Temp:   Resp:     Intake/Output Summary (Last 24 hours) at 09/22/14 1137 Last data filed at 09/22/14 1042  Gross per 24 hour  Intake 1991.3 ml  Output    500 ml  Net 1491.3 ml   Filed Weights   09/20/14 1824 09/21/14 0545 09/22/14 0519  Weight: 80.65 kg (177 lb 12.8 oz) 78.835 kg (173 lb 12.8 oz) 83.28 kg (183 lb 9.6 oz)    Exam:  Gen: Alert [place] male in NAD. On O2 Marie Chest: good air  movement bilaterally Cardiac: Regular rate and rhythm, S1-S2, no rubs murmurs or gallops  Abdomen: soft, non tender, non distended, +bowel sounds. No guarding or rigidity  Extremities: Symmetrical in appearance without cyanosis or edema  Neurological: Alert & oriented x3    Data Reviewed: Basic Metabolic Panel:  Recent Labs Lab 09/20/14 1417 09/21/14 0410 09/22/14 0019  NA 139 140 140  K 3.7 3.9 4.1  CL 98 99 101  CO2 34* 35* 32  GLUCOSE 90 69* 106*  BUN 22 21 28*  CREATININE 1.49* 1.44* 1.58*  CALCIUM 8.2* 8.3* 8.1*   Liver Function Tests:  Recent Labs Lab 09/20/14 1417  AST 25  ALT 12  ALKPHOS 78  BILITOT 0.6  PROT 6.0  ALBUMIN 2.9*   No results for input(s): LIPASE, AMYLASE in the last 168 hours. No results for input(s): AMMONIA in the last 168 hours. CBC:  Recent Labs Lab 09/20/14 1417 09/21/14 0410 09/22/14 0019  WBC 5.7 6.3 8.1  NEUTROABS 4.3  --   --   HGB 12.0* 12.3* 11.9*  HCT 37.4* 38.3* 36.1*  MCV 103.9* 107.0* 106.2*  PLT 184 200 179   Cardiac Enzymes:  Recent Labs Lab 09/21/14 1509 09/21/14 2130 09/22/14 0019  CKTOTAL 134 142 138  CKMB 2.3 2.2 2.2  TROPONINI 0.06* 0.06* 0.06*   BNP (last 3 results) No results for input(s): PROBNP in the last 8760 hours. CBG: No results for input(s): GLUCAP in the last 168 hours.  Recent Results (from the past 240 hour(s))  Urine culture     Status: None   Collection Time: 09/20/14  4:21 PM  Result Value Ref Range Status   Specimen Description URINE, CATHETERIZED  Final   Special Requests Normal  Final   Colony Count NO GROWTH Performed at [facility]   Final   Culture NO GROWTH Performed at [facility]   Final   Report Status 09/21/2014 FINAL  Final  MRSA PCR Screening     Status: None   Collection Time: 09/20/14  8:32 PM  Result Value Ref Range Status   MRSA by PCR NEGATIVE NEGATIVE Final    Comment:        The GeneXpert MRSA Assay (FDA approved for NASAL  specimens only), is one component of a comprehensive MRSA colonization surveillance program. It is not intended to diagnose MRSA infection nor to guide or monitor treatment for MRSA infections.   Culture, blood (routine x 2)     Status: None (Preliminary result)   Collection Time: 09/21/14  3:47 PM  Result Value Ref Range Status   Specimen Description BLOOD RIGHT ARM  Final   Special Requests BOTTLES DRAWN AEROBIC AND ANAEROBIC 10CC  Final   Culture   Final           BLOOD CULTURE RECEIVED NO GROWTH TO DATE CULTURE WILL BE HELD FOR 5 DAYS BEFORE ISSUING A FINAL NEGATIVE REPORT Performed at [facility]    Report Status PENDING  Incomplete  Culture, blood (routine x 2)     Status: None (Preliminary result)   Collection Time: 09/21/14  3:53 PM  Result Value Ref Range  Status   Specimen Description BLOOD RIGHT HAND  Final   Special Requests BOTTLES DRAWN AEROBIC AND ANAEROBIC 10CC  Final   Culture   Final           BLOOD CULTURE RECEIVED NO GROWTH TO DATE CULTURE WILL BE HELD FOR 5 DAYS BEFORE ISSUING A FINAL NEGATIVE REPORT Performed at [facility]    Report Status PENDING  Incomplete     Studies: Dg Chest 2 View  09/20/2014   CLINICAL DATA:  Per EMS, pt coming from [place] nursing facility for increased lethargy x3 days. Pt was recently diagnosed with pneumonia and has 3 days of his antibiotics left. Pt is always on O2 3 L [place] at home. Pt has hx of A-fib, COPD, chronic diastolic heart failure. Past-smoker.  EXAM: CHEST  2 VIEW  COMPARISON:  10/20/2013  FINDINGS: The heart is enlarged. There is dense opacity at the right lung base which obscures the hemidiaphragm. Right pleural effusion is present. Small left pleural effusion is present. No pulmonary edema.  IMPRESSION: 1. Cardiomegaly. 2. Right lower lobe infiltrate or atelectasis. 3. Bilateral pleural effusions, right greater than left.   Electronically Signed   By: Rosalie Gums M.D.   On: 09/20/2014 15:14     Scheduled Meds: . antiseptic oral rinse  7 mL Mouth Rinse BID  . clopidogrel  75 mg Oral Daily  . enoxaparin (LOVENOX) injection  30 mg Subcutaneous Q24H  . furosemide  40 mg Oral Daily  . lactobacillus acidophilus  1 tablet Oral BID  . levothyroxine  75 mcg Oral QAC breakfast  . loratadine  10 mg Oral Daily  . metoprolol tartrate  12.5 mg Oral BID  . piperacillin-tazobactam (ZOSYN)  IV  3.375 g Intravenous Q8H  . vancomycin  1,000 mg Intravenous Q24H   Continuous Infusions:   Principal Problem:   HCAP (healthcare-associated pneumonia) Active Problems:   Permanent atrial fibrillation   COPD (chronic obstructive pulmonary disease)   RBBB with left anterior fascicular block   Hypothyroid   Chronic diastolic heart failure, NYHA class 2; status post recent exacerbation in May 2015   Acute encephalopathy   Acute on chronic respiratory failure with hypoxia   Failure to thrive in adult   Acute kidney injury   Do not resuscitate    Time spent: 39    Illa [facility]  [facility] Pager [phone]. If 7PM-7AM, please contact night-coverage at [email or web address], password [facility] 09/22/2014, 11:37 AM  LOS: 2 days      I have personally examined the patient and reviewed the entire database. Agree with the above note and plan as outlined, any necessary changes made. 81 year old male with altered mental status, right lower lung pneumonia and atrial fibrillation with RVR yesterday. Appreciate cardiology following and recommendations. Spoke to the daughter in detail and she is open to the idea of palliative medicine and hospice. Plan would be to return to the facility once patient is more stable with palliative to follow.  Abygayle Deltoro M.D. [facility] 09/22/2014, 12:27 PM Pager: [phone]  If 7PM-7AM, please contact night-coverage [email or web address] Password [email or web address]

## 2014-09-22 NOTE — Consult Note (Signed)
Patient ZO:[id number] Jeremiah Mcmahon      DOB: 10/12/1913      AVW:[id number]     Consult Note from the Palliative Medicine Team at [facility]    Consult Requested by: Dr Isidoro Donning     PCP: Darlina Guys, MD Reason for Consultation: goals of care    Phone Number:[phone]  Assessment/Recommendations: 81 yo male with PMHx of Afib, diastolic HF, COPD on chronic O2 who presented with PNA.   1.  Code Status: DNR  2. Goals of Care: Spoke extensively with daughter [name] today as well as with [employer].  [name] hopes to get him to SNF/Rehab for a time and then potentially return to ALF or [name] is contemplating taking him home with her and the assistance of paid caregivers. There has been frustration on her part on lack of medical attention to Jeremiah Mcmahon's problems. She reports that this is his 3rd PNA in past 1.5 years and wonders if this is related to him being in health care facility.  She also has concerns about their responsiveness to his needs.  She currently lives on a 3rd floor apartment and does not think this would be feasible.  [name] has had ongoing functional decline, but [name] hopeful rehab potentially could get some strength back. She is interested in hospice care for her father after rehab.  She formerly volunteered for hospice in [facility] and has fairly good insight into what services are offered.  If Jaeven were to d/c to SNF, i would recommend outpatient Palliative Care referral to help arrange hospice after this stay.  Demond does not engage me much but has been talking to [name] about things like hospice.  He is frustrated with his loss of functional independence and not being able to enjoy what he used to.    3. Symptom Management:   1. Loss of Appetite- in setting of acute illness. May be losing some weight and albumin 2.9 as well. Monitor, can consider appetite stimulant 2. Acute Encephalopathy- likely related to infection. Would avoid benzo's. Try to ensure good sleep/wake cycles.    4.  Psychosocial/Spiritual: Lives at ALF. Has 2 daughters.  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  Enjoys travel, reading, art. Frustrated by loss of independence.    Brief HPI: Patient is a 81 yo male with PMHx of Afib, diastolic HF, COPD on O2 chronically who presented from ALF altered mental status and cough.  Apparently had been on treatment for presumed PNA as well with levaquin.  Fell 3 days prior to admission. Noted to have decreased O2 sats and progressively worsening confusion. Daughter also reported progressive functional decline over past few months.  Had normally been able to ambulate independently but not getting up as much and reportedly using wheelchair more.  Today [name] reports no complaints. Daughter states that his thinking seems to be becoming clearer but he is more sleepy today.  Ate some breakfast. There is question of weight loss.  [name] has been thinking about hospice care for Jeremiah Mcmahon seems open to.  He is frustrated by loss of independence and not being able to do things he enjoys most like travel, creating art, and even reading recently.         PMH:  Past Medical History  Diagnosis Date  . Atrial fibrillation     rate controlled; asymptomatic  . Swelling of ankle   . Macular degeneration   . H/O spinal stenosis     R foot drop  . COPD, mild   .  Chronic diastolic heart failure, NYHA class 2     With recent exacerbation - exacerbated by A. fib RVR  . Dysrhythmia     HX OF ATRIAL FIBRILATION  . CHF (congestive heart failure)   . Depression   . Pneumonia 09/2014     PSH: Past Surgical History  Procedure Laterality Date  . Doppler echocardiography  May 2012    Normal EF greater than 55%. Moderate left and right atria dilation; Mild MR; mild aortic sclerosis without stenosis    I have reviewed the FH and SH and  If appropriate update it with new information. No Known Allergies Scheduled Meds: . antiseptic oral rinse  7 mL Mouth Rinse BID  .  clopidogrel  75 mg Oral Daily  . enoxaparin (LOVENOX) injection  30 mg Subcutaneous Q24H  . furosemide  40 mg Oral Daily  . lactobacillus acidophilus  1 tablet Oral BID  . levothyroxine  75 mcg Oral QAC breakfast  . loratadine  10 mg Oral Daily  . metoprolol tartrate  12.5 mg Oral BID  . piperacillin-tazobactam (ZOSYN)  IV  3.375 g Intravenous Q8H  . vancomycin  1,000 mg Intravenous Q24H   Continuous Infusions:  PRN Meds:.acetaminophen, albuterol, guaifenesin, ondansetron (ZOFRAN) IV    BP 104/68 mmHg  Pulse 91  Temp(Src) 97.6 F (36.4 C) (Oral)  Resp 16  Ht 5\' 9"  (1.753 m)  Wt 83.28 kg (183 lb 9.6 oz)  BMI 27.10 kg/m2  SpO2 93%   PPS: 40   Intake/Output Summary (Last 24 hours) at 09/22/14 1129 Last data filed at 09/22/14 1042  Gross per 24 hour  Intake 1991.3 ml  Output    500 ml  Net 1491.3 ml    Physical Exam:  General: Alert, NAD HEENT:  , sclera anicteric, mmm Chest:   CTAB CVS: irregular Abdomen: soft, ND   Labs: CBC    Component Value Date/Time   WBC 8.1 09/22/2014 0019   RBC 3.40* 09/22/2014 0019   HGB 11.9* 09/22/2014 0019   HCT 36.1* 09/22/2014 0019   PLT 179 09/22/2014 0019   MCV 106.2* 09/22/2014 0019   MCH 35.0* 09/22/2014 0019   MCHC 33.0 09/22/2014 0019   RDW 13.9 09/22/2014 0019   LYMPHSABS 0.6* 09/20/2014 1417   MONOABS 0.6 09/20/2014 1417   EOSABS 0.2 09/20/2014 1417   BASOSABS 0.0 09/20/2014 1417    BMET    Component Value Date/Time   NA 140 09/22/2014 0019   K 4.1 09/22/2014 0019   CL 101 09/22/2014 0019   CO2 32 09/22/2014 0019   GLUCOSE 106* 09/22/2014 0019   BUN 28* 09/22/2014 0019   CREATININE 1.58* 09/22/2014 0019   CALCIUM 8.1* 09/22/2014 0019   GFRNONAA 35* 09/22/2014 0019   GFRAA 40* 09/22/2014 0019    CMP     Component Value Date/Time   NA 140 09/22/2014 0019   K 4.1 09/22/2014 0019   CL 101 09/22/2014 0019   CO2 32 09/22/2014 0019   GLUCOSE 106* 09/22/2014 0019   BUN 28* 09/22/2014 0019    CREATININE 1.58* 09/22/2014 0019   CALCIUM 8.1* 09/22/2014 0019   PROT 6.0 09/20/2014 1417   ALBUMIN 2.9* 09/20/2014 1417   AST 25 09/20/2014 1417   ALT 12 09/20/2014 1417   ALKPHOS 78 09/20/2014 1417   BILITOT 0.6 09/20/2014 1417   GFRNONAA 35* 09/22/2014 0019   GFRAA 40* 09/22/2014 0019   1/4 CXR IMPRESSION: 1. Cardiomegaly. 2. Right lower lobe infiltrate or atelectasis. 3. Bilateral pleural  effusions, right greater than left.   Total Time: 85 minutes Greater than 50%  of this time was spent counseling and coordinating care related to the above assessment and plan.   Orvis Brill D.O. Palliative Medicine Team at [facility]  Pager: [phone] Team Phone: [phone]

## 2014-09-22 NOTE — Progress Notes (Addendum)
Notified on-call provider of SBP <100 and Lopressor scheduled for now. Instructed to wait to give Lopressor and move administration time to later in the morning in case SBP rises above 100. Spoke with Jeremiah Mcmahon in pharmacy to verify that moving dose to 0800 would be acceptable given then next administration time is scheduled for 1400. Pharmacy verified that dose scheduled at 0800 would be acceptable. Will continue to monitor.

## 2014-09-22 NOTE — Progress Notes (Signed)
Subjective: Patinet sleeping but awakes for questions  Appropriate  Denies CP   Objective: Filed Vitals:   09/22/14 0241 09/22/14 0244 09/22/14 0519 09/22/14 0824  BP: 92/59  98/69 104/68  Pulse: 89 89 90 91  Temp: 97.8 F (36.6 C)  97.6 F (36.4 C)   TempSrc: Oral  Oral   Resp: 15  16   Height:      Weight:   183 lb 9.6 oz (83.28 kg)   SpO2: 93% 95% 93%    Weight change: 5 lb 12.8 oz (2.631 kg)  Intake/Output Summary (Last 24 hours) at 09/22/14 1054 Last data filed at 09/22/14 1042  Gross per 24 hour  Intake 1991.3 ml  Output    500 ml  Net 1491.3 ml    General: Alert, awake, oriented x3, in no acute distress Neck:  JVP is normal Heart: Irregular rate and rhythm, without murmurs, rubs, gallops.  Lungs:  REl CTA  No rales or wheezes. Exemities:  No edema.    Tele:  Afib  Avg HR around 100  .   Lab Results: Results for orders placed or performed during the hospital encounter of 09/20/14 (from the past 24 hour(s))  Troponin I     Status: Abnormal   Collection Time: 09/21/14  3:09 PM  Result Value Ref Range   Troponin I 0.06 (H) <0.031 ng/mL   CK total and CKMB (cardiac)     Status: None   Collection Time: 09/21/14  3:09 PM  Result Value Ref Range   Total CK 134 7 - 232 U/L   CK, MB 2.3 0.3 - 4.0 ng/mL   Relative Index 1.7 0.0 - 2.5  Lactic acid, plasma     Status: None   Collection Time: 09/21/14  3:47 PM  Result Value Ref Range   Lactic Acid, Venous 2.2 0.5 - 2.2 mmol/L  Culture, blood (routine x 2)     Status: None (Preliminary result)   Collection Time: 09/21/14  3:47 PM  Result Value Ref Range   Specimen Description BLOOD RIGHT ARM    Special Requests BOTTLES DRAWN AEROBIC AND ANAEROBIC 10CC    Culture             BLOOD CULTURE RECEIVED NO GROWTH TO DATE CULTURE WILL BE HELD FOR 5 DAYS BEFORE ISSUING A FINAL NEGATIVE REPORT [facility]    Report Status PENDING   Culture, blood (routine x 2)     Status: None (Preliminary result)     Collection Time: 09/21/14  3:53 PM  Result Value Ref Range   Specimen Description BLOOD RIGHT HAND    Special Requests BOTTLES DRAWN AEROBIC AND ANAEROBIC 10CC    Culture             BLOOD CULTURE RECEIVED NO GROWTH TO DATE CULTURE WILL BE HELD FOR 5 DAYS BEFORE ISSUING A FINAL NEGATIVE REPORT Performed at [facility]    Report Status PENDING   Brain natriuretic peptide     Status: Abnormal   Collection Time: 09/21/14  5:45 PM  Result Value Ref Range   B Natriuretic Peptide 607.7 (H) 0.0 - 100.0 pg/mL  Troponin I     Status: Abnormal   Collection Time: 09/21/14  9:30 PM  Result Value Ref Range   Troponin I 0.06 (H) <0.031 ng/mL   CK total and CKMB (cardiac)     Status: None   Collection Time: 09/21/14  9:30 PM  Result Value Ref Range  Total CK 142 7 - 232 U/L   CK, MB 2.2 0.3 - 4.0 ng/mL   Relative Index 1.5 0.0 - 2.5  Troponin I     Status: Abnormal   Collection Time: 09/22/14 12:19 AM  Result Value Ref Range   Troponin I 0.06 (H) <0.031 ng/mL   CK total and CKMB (cardiac)     Status: None   Collection Time: 09/22/14 12:19 AM  Result Value Ref Range   Total CK 138 7 - 232 U/L   CK, MB 2.2 0.3 - 4.0 ng/mL   Relative Index 1.6 0.0 - 2.5  CBC     Status: Abnormal   Collection Time: 09/22/14 12:19 AM  Result Value Ref Range   WBC 8.1 4.0 - 10.5 K/uL   RBC 3.40 (L) 4.22 - 5.81 MIL/uL   Hemoglobin 11.9 (L) 13.0 - 17.0 g/dL   HCT 60.4 (L) 54.0 - 98.1 %   MCV 106.2 (H) 78.0 - 100.0 fL   MCH 35.0 (H) 26.0 - 34.0 pg   MCHC 33.0 30.0 - 36.0 g/dL   RDW 19.1 47.8 - 29.5 %   Platelets 179 150 - 400 K/uL  Basic metabolic panel     Status: Abnormal   Collection Time: 09/22/14 12:19 AM  Result Value Ref Range   Sodium 140 135 - 145 mmol/L   Potassium 4.1 3.5 - 5.1 mmol/L   Chloride 101 96 - 112 mEq/L   CO2 32 19 - 32 mmol/L   Glucose, Bld 106 (H) 70 - 99 mg/dL   BUN 28 (H) 6 - 23 mg/dL   Creatinine, Ser 6.21 (H) 0.50 - 1.35 mg/dL   Calcium 8.1 (L) 8.4 - 10.5 mg/dL    GFR calc non Af Amer 35 (L) >90 mL/min   GFR calc Af Amer 40 (L) >90 mL/min   Anion gap 7 5 - 15    Studies/Results: No results found.  Medications: Reviewed   @  1  Mental status changes  MS is improved today  Ques due to fluid or ABX  Favor cont ABX  Pt coughed up 2 bits of thick yellow /brown sputum   I would not hydrate further  2.  Dyspnea.  BNP  Mildly elevated  I would add Lasix 40 mg qd and follow exam.  Hx diastolic CHF  (He is on demedex 20 at home)  Follow labs and exam.    3.  Atrial fib  Off of dilt  I do not think he would tolerate with current bp  Getting low dose lopressor  WOuld transition to 12.5 po bid  4.  Hypotension  BP still 90s to 100s  Follow    Patient remains fragile  Spoke to daughter  Palliative care to see patient  She is currently thinking of taking patient home and adding some CNA care as well around the clock  Needs a new house to do this       LOS: 2 days   Dietrich Pates 09/22/2014, 10:54 AM

## 2014-09-23 DIAGNOSIS — R4182 Altered mental status, unspecified: Secondary | ICD-10-CM

## 2014-09-23 DIAGNOSIS — I959 Hypotension, unspecified: Secondary | ICD-10-CM

## 2014-09-23 DIAGNOSIS — I4891 Unspecified atrial fibrillation: Secondary | ICD-10-CM

## 2014-09-23 LAB — CBC
HEMATOCRIT: 35.4 % — AB (ref 39.0–52.0)
HEMOGLOBIN: 11.3 g/dL — AB (ref 13.0–17.0)
MCH: 33.1 pg (ref 26.0–34.0)
MCHC: 31.9 g/dL (ref 30.0–36.0)
MCV: 103.8 fL — ABNORMAL HIGH (ref 78.0–100.0)
Platelets: 171 10*3/uL (ref 150–400)
RBC: 3.41 MIL/uL — AB (ref 4.22–5.81)
RDW: 13.9 % (ref 11.5–15.5)
WBC: 7 10*3/uL (ref 4.0–10.5)

## 2014-09-23 LAB — BASIC METABOLIC PANEL
Anion gap: 6 (ref 5–15)
BUN: 25 mg/dL — ABNORMAL HIGH (ref 6–23)
CALCIUM: 8.3 mg/dL — AB (ref 8.4–10.5)
CHLORIDE: 99 meq/L (ref 96–112)
CO2: 36 mmol/L — AB (ref 19–32)
Creatinine, Ser: 1.62 mg/dL — ABNORMAL HIGH (ref 0.50–1.35)
GFR calc Af Amer: 39 mL/min — ABNORMAL LOW (ref >90)
GFR calc non Af Amer: 34 mL/min — ABNORMAL LOW (ref >90)
GLUCOSE: 100 mg/dL — AB (ref 70–99)
POTASSIUM: 4.1 mmol/L (ref 3.5–5.1)
Sodium: 141 mmol/L (ref 135–145)

## 2014-09-23 LAB — URINALYSIS, ROUTINE W REFLEX MICROSCOPIC
Bilirubin Urine: NEGATIVE
GLUCOSE, UA: NEGATIVE mg/dL
HGB URINE DIPSTICK: NEGATIVE
Ketones, ur: NEGATIVE mg/dL
Leukocytes, UA: NEGATIVE
Nitrite: NEGATIVE
PH: 5 (ref 5.0–8.0)
Protein, ur: NEGATIVE mg/dL
SPECIFIC GRAVITY, URINE: 1.024 (ref 1.005–1.030)
Urobilinogen, UA: 0.2 mg/dL (ref 0.0–1.0)

## 2014-09-23 MED ORDER — MIRTAZAPINE 7.5 MG PO TABS
7.5000 mg | ORAL_TABLET | Freq: Every day | ORAL | Status: DC
  Administered 2014-09-23: 7.5 mg via ORAL
  Filled 2014-09-23 (×2): qty 1

## 2014-09-23 MED ORDER — TAMSULOSIN HCL 0.4 MG PO CAPS
0.4000 mg | ORAL_CAPSULE | Freq: Every day | ORAL | Status: DC
  Administered 2014-09-23 – 2014-09-24 (×2): 0.4 mg via ORAL
  Filled 2014-09-23 (×2): qty 1

## 2014-09-23 NOTE — Progress Notes (Signed)
Physical Therapy Treatment Patient Details Name: Jeremiah Mcmahon MRN: [id number] DOB: 1914-06-29 Today's Date: 09/23/2014    History of Present Illness Pt is a 81 y.o. male, resident of ALF, with history of chronic diastolic CHF, permanent atrial fibrillation, RBBB with left anterior fascicular block, bilateral leg edema, hypotension secondary to medications, chronic respiratory failure on home oxygen 2 L/m, presented from ALF with above symptoms. Patient apparently has been gradually declining since his hospitalization middle of 2015. Due to worsening condition and lack of improvement, he presented to the ED.    PT Comments    Pt progressing towards physical therapy goals. Pt was able to ambulate ~15 feet this session with +2 assist for balance. Pt on 3L/min supplemental O2 throughout session and sats were at 91% after gait training. Pt somewhat limited by R elbow pain throughout session - feels it is due to sleeping on it wrong yesterday. RN notified.   Follow Up Recommendations  SNF;Supervision/Assistance - 24 hour     Equipment Recommendations  None recommended by PT    Recommendations for Other Services       Precautions / Restrictions Precautions Precautions: Fall Restrictions Weight Bearing Restrictions: No    Mobility  Bed Mobility Overal bed mobility: Needs Assistance Bed Mobility: Rolling;Sidelying to Sit Rolling: Min assist Sidelying to sit: Mod assist       General bed mobility comments:   Pt was able to transition to EOB with assist for trunk elevation. Hand-over-hand assist for reaching and grasping bed rails required.    Transfers Overall transfer level: Needs assistance Equipment used: Rolling walker (2 wheeled) Transfers: Sit to/from Stand Sit to Stand: Min assist;+2 physical assistance         General transfer comment: Verbal and tactile cueing required for hand placement on seated surface for safety. Pt did not want to use RUE due to elbow  pain.  Ambulation/Gait Ambulation/Gait assistance: Mod assist;+2 physical assistance Ambulation Distance (Feet): 15 Feet Assistive device: Rolling walker (2 wheeled) Gait Pattern/deviations: Decreased stride length;Trunk flexed;Shuffle;Narrow base of support Gait velocity: Decreased Gait velocity interpretation: Below normal speed for age/gender General Gait Details: Pt was able to ambulate with assist for anterior lean and walker direction. Family member followed with chair so therapist and tech could keep hands-on assist with patient throughout gait training.    Stairs            Wheelchair Mobility    Modified Rankin (Stroke Patients Only)       Balance Overall balance assessment: Needs assistance Sitting-balance support: Feet supported;No upper extremity supported Sitting balance-Leahy Scale: Fair Sitting balance - Comments: Requires cueing to maintain upright posture Postural control: Posterior lean Standing balance support: Bilateral upper extremity supported Standing balance-Leahy Scale: Zero Standing balance comment: Requires +2 assist for dynamic standing balance.                     Cognition Arousal/Alertness: Awake/alert Behavior During Therapy: Flat affect Overall Cognitive Status: Within Functional Limits for tasks assessed       Memory: Decreased short-term memory              Exercises      General Comments        Pertinent Vitals/Pain Pain Assessment: No/denies pain    Home Living                      Prior Function            PT Goals (  current goals can now be found in the care plan section) Acute Rehab PT Goals Patient Stated Goal: not stated PT Goal Formulation: With patient/family Time For Goal Achievement: 10/05/14 Potential to Achieve Goals: Good Progress towards PT goals: Progressing toward goals    Frequency  Min 2X/week    PT Plan Current plan remains appropriate    Co-evaluation              End of Session Equipment Utilized During Treatment: Gait belt;Oxygen Activity Tolerance: Patient limited by fatigue Patient left: in chair;with chair alarm set;with call bell/phone within reach;with nursing/sitter in room;with family/visitor present     Time: [id number] PT Time Calculation (min) (ACUTE ONLY): 24 min  Charges:  $Gait Training: 8-22 mins $Therapeutic Activity: 8-22 mins                    G Codes:      Conni SlipperKirkman, Zohal Reny 09/23/2014, 11:20 AM   Conni SlipperLaura Anthonyjames Bargar, PT, DPT Acute Rehabilitation Services Pager: [phone]

## 2014-09-23 NOTE — Progress Notes (Signed)
Speech Language Pathology Treatment: Dysphagia  Patient Details Name: Jeremiah Mcmahon MRN: [id number] DOB: [birth year] Today's Date: 09/23/2014 Time: 0950-1002 SLP Time Calculation (min) (ACUTE ONLY): 12 min  Assessment / Plan / Recommendation Clinical Impression  Skilled treatment session focused on addressing education with patient and daughter following yesterday's objective assessment.  Daughter reported that patient consumed breakfast with use of posted safe swallow strategies and her assist without coughing.  SLP educated on findings with 1 instance of observed aspiration following patient swishing to remove regular texture residue due to premature bolus loss, reduced attention to bolus and volume of sip.  Daughter verbalized understanding of information and reported having no other questions.  As a result, to further acute SLP needs at this time; recommend discharge.      HPI HPI: Jeremiah Mcmahon is a 81 y.o. male , resident of ALF, with history of chronic diastolic CHF, permanent atrial fibrillation-too high risk for anticoagulation given advanced age and fall risk, RBBB with left anterior fascicular block, bilateral leg edema, hypotension secondary to medications, chronic respiratory failure on home oxygen 2 L/m, presented from assisted living facility with above symptoms. Patient unable to provide any history secondary to altered mental status. History obtained from patient's daughter/healthcare power of attorney Ms. Gayle at bedside. Patient apparently has been gradually declining since his hospitalization middle of 2015. Of late he's been mostly wheelchair mobile and ambulates infrequently. A week ago, patient had cough and was diagnosed with pneumonia based on chest x-ray and was started on antibiotics/levofloxacin. 3 days back patient appeared disoriented and sustained a fall in his room and was noted to have oxygen saturations in the 80s off oxygen. Since then patient has continued to have wet  sounding cough but is unable to bring up sputum, dyspnea, disorientation, excessive sleepiness, poor oral intake, progressive weakness and ongoing issues with hypoxia and his oxygen was increased to 3.5 L/m. Due to worsening condition and lack of improvement, he presented to the ED where he appears clinically dry, soft blood pressures, creatinine 1.49, chest x-ray shows right lower lobe infiltrate or atelectasis.    Pertinent Vitals Pain Assessment: No/denies pain  SLP Plan  Discharge SLP treatment due to (comment);Goals met (education completed)    Recommendations Diet recommendations: Dysphagia 3 (mechanical soft);Thin liquid Liquids provided via: Cup;Straw Medication Administration: Whole meds with puree Supervision: Full supervision/cueing for compensatory strategies Compensations: Slow rate;Small sips/bites Postural Changes and/or Swallow Maneuvers: Seated upright 90 degrees;Upright 30-60 min after meal              Oral Care Recommendations: Oral care BID Follow up Recommendations: Skilled Nursing facility;24 hour supervision/assistance Plan: Discharge SLP treatment due to (comment);Goals updated (education completed)    GO    Carmelia Roller., CCC-SLP [phone]  [place] 09/23/2014, 2:06 PM

## 2014-09-23 NOTE — Progress Notes (Signed)
[facility] PROGRESS NOTE  Jeremiah Mcmahon ZOX:[id number] DOB: 1914/08/15 DOA: 09/20/2014 PCP: Jeremiah Guys, MD   Brief history of present illness  Patient is a 81 year old male resident of the ALF, chronic diastolic CHF, atrial fibrillation, high risk for anticoagulation given her advanced age and fall risk, chronic respiratory failure presented from ALF with altered mental status, hypoxia cough and dyspnea. Chest x-ray showed right lower lobe infiltrates and patient was admitted for further workup.  Assessment/Plan:  Healthcare associated pneumonia: Likely aspiration -Most likely aspiration, modified barium study with recommendations for dysphagia 3 diet with thin liquids, and aspiration precautions as ALF, where he resides. He was recently on Levaquin for PNA in outpatient setting -Remains afebrile, no leukocytosis- lungs with good air movement bilaterally. On 2 L [place] (chronically on 2 L [place]) -Continue IV vancomycin and Zosyn (day 4) -MBS- Dysphagia 3; Thin liquid.  -Continue mucolytic, supp O2 PRN  Urinary retention -With episode of urinary rentention overnight, bladder scan . In and out cath X1 -Continue to monitor, placed on Flomax, Foley catheter to be placed. Patient has an outpatient urologist in [facility]. He will have a voiding trial outpatient in one week. UA negative for UTI.  Dehydration, hypotension -BP remains soft- pt with chronic hypotension, likely worsened by poor oral intake -Given NS bolus X1. -Per cardiology- restart Lasix 40mg  qd, no further hydration  Diastolic CHF -+1 LE edema, lungs without crackles -2D echo-EF 55-60, limited study -Per Cards- Continue Lasix 40mg  PO daily    Acute kidney injury -Secondary to dehydration- unknown if has CKD -Creatinine 1.62, baseline unknown  Acute encephalopathy -Resolved, A &OX3  Atrial fibrillation with RVR -Rate controlled. -09/21/13- with episode of afib with RVR, due to soft BP, cardiazem held.  Given one time dose Digoxin and  fluid bolus -Cards on board, rec's- transitioned to oral Metoprolol 12.5 po BID -not on anticoagulation due to advanced age/risk of fall -Continue Plavix  Acute on chronic hypoxic respiratory failure -Stable, on home regimen of 2L [place]  Hypothyroidism -Continue Synthroid 75 MCG daily   DVT Prophylaxis SQ Lovenox  Code Status: DNR Family Communication: Daughter at bedside Disposition Plan: Per CSW-Daughter plans for the patient to DC back to [place] but at SNF level of care   Consultants:  None  Procedures:  None  Antibiotics:  IV Vancomycin: 09/21/13 >>>  IV Zosyn: 09/21/13 >>>  HPI/Subjective: Jeremiah Mcmahon is a 81 yo male, resident of ALF who ambulates on wheelchair, with PMH of Afib on cardiazem, Chronic diastolic CHF (EF 81-19%), RBBB with left anterior fasicular block, hypotension secondary to medications, chronic respiratory failure on 2l/m O2, that presented with altered mental status, decreased O2 saturations, and worsening cough and dyspnea.  History is obtained form daughter, Ms.  Jeremiah Mcmahon. Pt with recent hospitalizations  Mid 2015, and has been worsenins since. Symptoms started 3 days ago, and patient has had inc O2 demands, requiring inc to 3.5L/min.  He also sustained a fall in his room 3 days ago.  In ED, pt is appears dehydrated, with soft BP, Creat 1.49, and CXR with evidence of PNA.     A &O X3, NAD.  Denies any chest pain, sob.  Objective: Filed Vitals:   09/23/14 0611  BP: 103/63  Pulse: 81  Temp: 97.6 F (36.4 C)  Resp: 20    Intake/Output Summary (Last 24 hours) at 09/23/14 1028 Last data filed at 09/23/14 0800  Gross per 24 hour  Intake   1028 ml  Output  800 ml  Net    228 ml   Filed Weights   09/21/14 0545 09/22/14 0519 09/23/14 0628  Weight: 78.835 kg (173 lb 12.8 oz) 83.28 kg (183 lb 9.6 oz) 82.5 kg (181 lb 14.1 oz)    Exam:  Gen: Alert [place] male in NAD. On O2 [place] Chest: good air movement  bilaterally, no crackles Cardiac: Regular rate and rhythm, S1-S2, no rubs murmurs or gallops  Abdomen: soft, non tender, non distended, +bowel sounds. No guarding or rigidity  Extremities: Symmetrical in appearance without cyanosis, +1 edema Neurological: Alert & oriented x3    Data Reviewed: Basic Metabolic Panel:  Recent Labs Lab 09/20/14 1417 09/21/14 0410 09/22/14 0019 09/23/14 0826  NA 139 140 140 141  K 3.7 3.9 4.1 4.1  CL 98 99 101 99  CO2 34* 35* 32 36*  GLUCOSE 90 69* 106* 100*  BUN 22 21 28* 25*  CREATININE 1.49* 1.44* 1.58* 1.62*  CALCIUM 8.2* 8.3* 8.1* 8.3*   Liver Function Tests:  Recent Labs Lab 09/20/14 1417  AST 25  ALT 12  ALKPHOS 78  BILITOT 0.6  PROT 6.0  ALBUMIN 2.9*   No results for input(s): LIPASE, AMYLASE in the last 168 hours. No results for input(s): AMMONIA in the last 168 hours. CBC:  Recent Labs Lab 09/20/14 1417 09/21/14 0410 09/22/14 0019 09/23/14 0826  WBC 5.7 6.3 8.1 7.0  NEUTROABS 4.3  --   --   --   HGB 12.0* 12.3* 11.9* 11.3*  HCT 37.4* 38.3* 36.1* 35.4*  MCV 103.9* 107.0* 106.2* 103.8*  PLT 184 200 179 171   Cardiac Enzymes:  Recent Labs Lab 09/21/14 1509 09/21/14 2130 09/22/14 0019  CKTOTAL 134 142 138  CKMB 2.3 2.2 2.2  TROPONINI 0.06* 0.06* 0.06*   BNP (last 3 results) No results for input(s): PROBNP in the last 8760 hours. CBG: No results for input(s): GLUCAP in the last 168 hours.  Recent Results (from the past 240 hour(s))  Urine culture     Status: None   Collection Time: 09/20/14  4:21 PM  Result Value Ref Range Status   Specimen Description URINE, CATHETERIZED  Final   Special Requests Normal  Final   Colony Count NO GROWTH Performed at [facility]   Final   Culture NO GROWTH Performed at [facility]   Final   Report Status 09/21/2014 FINAL  Final  MRSA PCR Screening     Status: None   Collection Time: 09/20/14  8:32 PM  Result Value Ref Range Status   MRSA by PCR  NEGATIVE NEGATIVE Final    Comment:        The GeneXpert MRSA Assay (FDA approved for NASAL specimens only), is one component of a comprehensive MRSA colonization surveillance program. It is not intended to diagnose MRSA infection nor to guide or monitor treatment for MRSA infections.   Culture, blood (routine x 2)     Status: None (Preliminary result)   Collection Time: 09/21/14  3:47 PM  Result Value Ref Range Status   Specimen Description BLOOD RIGHT ARM  Final   Special Requests BOTTLES DRAWN AEROBIC AND ANAEROBIC 10CC  Final   Culture   Final           BLOOD CULTURE RECEIVED NO GROWTH TO DATE CULTURE WILL BE HELD FOR 5 DAYS BEFORE ISSUING A FINAL NEGATIVE REPORT Performed at [facility]    Report Status PENDING  Incomplete  Culture, blood (routine x 2)  Status: None (Preliminary result)   Collection Time: 09/21/14  3:53 PM  Result Value Ref Range Status   Specimen Description BLOOD RIGHT HAND  Final   Special Requests BOTTLES DRAWN AEROBIC AND ANAEROBIC 10CC  Final   Culture   Final           BLOOD CULTURE RECEIVED NO GROWTH TO DATE CULTURE WILL BE HELD FOR 5 DAYS BEFORE ISSUING A FINAL NEGATIVE REPORT Performed at [facility]    Report Status PENDING  Incomplete     Studies: Dg Swallowing Func-speech Pathology  09/22/2014   Ophelia Shoulder, CCC-SLP     09/22/2014  4:32 PM Objective Swallowing Evaluation: Modified Barium Swallowing Study   Patient Details  Name: Jeremiah Mcmahon MRN: [id number] Date of Birth: June 19, 1914  Today's Date: 09/22/2014 Time: 4098-1191 SLP Time Calculation (min) (ACUTE ONLY): 25 min  Past Medical History:  Past Medical History  Diagnosis Date  . Atrial fibrillation     rate controlled; asymptomatic  . Swelling of ankle   . Macular degeneration   . H/O spinal stenosis     R foot drop  . COPD, mild   . Chronic diastolic heart failure, NYHA class 2     With recent exacerbation - exacerbated by A. fib RVR  . Dysrhythmia     HX OF ATRIAL  FIBRILATION  . CHF (congestive heart failure)   . Depression   . Pneumonia 09/2014   Past Surgical History:  Past Surgical History  Procedure Laterality Date  . Doppler echocardiography  May 2012    Normal EF greater than 55%. Moderate left and right atria  dilation; Mild MR; mild aortic sclerosis without stenosis    HPI:  Anand Tejada is a 81 y.o. male , resident of ALF, with history  of chronic diastolic CHF, permanent atrial fibrillation-too high  risk for anticoagulation given advanced age and fall risk, RBBB  with left anterior fascicular block, bilateral leg edema,  hypotension secondary to medications, chronic respiratory failure  on home oxygen 2 L/m, presented from assisted living facility  with above symptoms. Patient unable to provide any history  secondary to altered mental status. History obtained from  patient's daughter/healthcare power of attorney Ms. Gayle at  bedside. Patient apparently has been gradually declining since  his hospitalization middle of 2015. Of late he's been mostly  wheelchair mobile and ambulates infrequently. A week ago, patient  had cough and was diagnosed with pneumonia based on chest x-ray  and was started on antibiotics/levofloxacin. 3 days back patient  appeared disoriented and sustained a fall in his room and was  noted to have oxygen saturations in the 80s off oxygen. Since  then patient has continued to have wet sounding cough but is  unable to bring up sputum, dyspnea, disorientation, excessive  sleepiness, poor oral intake, progressive weakness and ongoing  issues with hypoxia and his oxygen was increased to 3.5 L/m. Due  to worsening condition and lack of improvement, he presented to  the ED where he appears clinically dry, soft blood pressures,  creatinine 1.49, chest x-ray shows right lower lobe infiltrate or  atelectasis.      Assessment / Plan / Recommendation Clinical Impression  Dysphagia Diagnosis: Mild oral phase dysphagia;Mild pharyngeal  phase dysphagia  Clinical impression: Patient presents with mild oral and  pharyngeal impairments that impact swallow function.  Soft solids  resulted in prolonged, but functional mastication.  Thin liquids  prematurely spilled to the level of the vallecula or pyriform  sinus depending on size of bolus and when patient was not  attending to bolus i.e. swishing liquids he would aspirate thin  liquids with delayed sensation.  Patient's strong cough was  effective at removing trace penetrates/aspirates.  Therefore  recommend to continue with current diet recommendations with full  supervision to ensure limited distractions, and small sips.  SLP  to follow up briefly for education with patient and family.     Treatment Recommendation  Therapy as outlined in treatment plan below    Diet Recommendation Dysphagia 3 (Mechanical Soft);Thin liquid   Liquid Administration via: Cup;Straw Medication Administration: Whole meds with puree Supervision: Full supervision/cueing for compensatory strategies Compensations: Slow rate;Small sips/bites Postural Changes and/or Swallow Maneuvers: Seated upright 90  degrees;Upright 30-60 min after meal    Other  Recommendations Oral Care Recommendations: Oral care BID   Follow Up Recommendations  Skilled Nursing facility;24 hour supervision/assistance    Frequency and Duration min 2x/week  2 weeks   Pertinent Vitals/Pain Patient lethargic during procedure    SLP Swallow Goals  See care plan    General HPI: Frederic Tones is a 81 y.o. male , resident of ALF,  with history of chronic diastolic CHF, permanent atrial  fibrillation-too high risk for anticoagulation given advanced age  and fall risk, RBBB with left anterior fascicular block,  bilateral leg edema, hypotension secondary to medications,  chronic respiratory failure on home oxygen 2 L/m, presented from  assisted living facility with above symptoms. Patient unable to  provide any history secondary to altered mental status. History  obtained from  patient's daughter/healthcare power of attorney Ms.  Gayle at bedside. Patient apparently has been gradually declining  since his hospitalization middle of 2015. Of late he's been  mostly wheelchair mobile and ambulates infrequently. A week ago,  patient had cough and was diagnosed with pneumonia based on chest  x-ray and was started on antibiotics/levofloxacin. 3 days back  patient appeared disoriented and sustained a fall in his room and  was noted to have oxygen saturations in the 80s off oxygen. Since  then patient has continued to have wet sounding cough but is  unable to bring up sputum, dyspnea, disorientation, excessive  sleepiness, poor oral intake, progressive weakness and ongoing  issues with hypoxia and his oxygen was increased to 3.5 L/m. Due  to worsening condition and lack of improvement, he presented to  the ED where he appears clinically dry, soft blood pressures,  creatinine 1.49, chest x-ray shows right lower lobe infiltrate or  atelectasis.  Type of Study: Modified Barium Swallowing Study Reason for Referral: Objectively evaluate swallowing function Previous Swallow Assessment: None Diet Prior to this Study: Dysphagia 3 (soft);Thin liquids Temperature Spikes Noted: No Respiratory Status: Nasal cannula History of Recent Intubation: No Behavior/Cognition: Cooperative;Lethargic;Requires cueing Oral Cavity - Dentition: Dentures, top;Adequate natural dentition Oral Motor / Sensory Function: Within functional limits Self-Feeding Abilities: Needs assist;Able to feed self Patient Positioning: Upright in chair Baseline Vocal Quality: Clear Volitional Cough: Strong;Congested Volitional Swallow: Able to elicit Anatomy: Within functional limits Pharyngeal Secretions: Not observed secondary MBS    Reason for Referral Objectively evaluate swallowing function   Oral Phase Oral Preparation/Oral Phase Oral Phase: Impaired Oral - Solids Oral - Puree: Within functional limits Oral - Mechanical Soft: Impaired  mastication;Other (Comment)  (prolonged but functional )   Pharyngeal Phase Pharyngeal Phase Pharyngeal Phase: Impaired Pharyngeal - Thin Pharyngeal - Thin Cup: Delayed swallow initiation;Premature  spillage to valleculae;Premature spillage to pyriform  sinuses;Penetration/Aspiration before swallow;Trace aspiration  Penetration/Aspiration details (thin cup): Material enters  airway, passes BELOW cords then ejected out Pharyngeal - Thin Straw: Within functional limits Pharyngeal Phase - Comment Pharyngeal Comment: attention and lethergy appered to impact  function  Cervical Esophageal Phase    GO   Fae Pippin, M.A., CCC-SLP [phone]  Cervical Esophageal Phase Cervical Esophageal Phase: [facility]         BOWIE,MELISSA 09/22/2014, 4:31 PM     Scheduled Meds: . antiseptic oral rinse  7 mL Mouth Rinse BID  . clopidogrel  75 mg Oral Daily  . enoxaparin (LOVENOX) injection  30 mg Subcutaneous Q24H  . furosemide  40 mg Oral Daily  . lactobacillus acidophilus  1 tablet Oral BID  . lactose free nutrition  237 mL Oral TID WC  . levothyroxine  75 mcg Oral QAC breakfast  . loratadine  10 mg Oral Daily  . metoprolol tartrate  12.5 mg Oral BID  . piperacillin-tazobactam (ZOSYN)  IV  3.375 g Intravenous Q8H  . tamsulosin  0.4 mg Oral Daily  . vancomycin  1,000 mg Intravenous Q24H   Continuous Infusions:   Principal Problem:   HCAP (healthcare-associated pneumonia) Active Problems:   Permanent atrial fibrillation   COPD (chronic obstructive pulmonary disease)   RBBB with left anterior fascicular block   Hypothyroid   Chronic diastolic heart failure, NYHA class 2; status post recent exacerbation in May 2015   Acute encephalopathy   Acute on chronic respiratory failure with hypoxia   Failure to thrive in adult   Acute kidney injury   Do not resuscitate   Loss of appetite   Palliative care encounter    Time spent:25    Illa Level [facility]  [facility] Pager [phone]. If 7PM-7AM, please contact  night-coverage at [email or web address], password [facility] 09/23/2014, 10:28 AM  LOS: 3 days       I have personally examined the patient and reviewed the entire database. Agree with the above note and plan as outlined, any necessary changes made in italics. Patient continues to have aspiration, coughing with meds and food. Discussed in detail with the patient's daughter, overall prognosis poor, due to age, fragility, aspiration. Daughter requesting DC to [facility] when medically stable. Started on Flomax, Foley catheter today, outpatient follow-up with urology.  RAI,RIPUDEEP M.D. [facility] 09/23/2014, 12:09 PM Pager: [phone]  If 7PM-7AM, please contact night-coverage [email or web address] Password [email or web address]

## 2014-09-23 NOTE — Progress Notes (Signed)
Subjective: Sitting up in bed preparing to have BK, no acute issues, daughter is at his bedside  Objective: Vital signs in last 24 hours: Temp:  [97.6 F (36.4 C)-98.3 F (36.8 C)] 97.6 F (36.4 C) (01/07 0611) Pulse Rate:  [81-98] 81 (01/07 0611) Resp:  [18-20] 20 (01/07 0611) BP: (90-103)/(63-69) 103/63 mmHg (01/07 0611) SpO2:  [94 %-97 %] 94 % (01/07 0611) Weight:  [181 lb 14.1 oz (82.5 kg)] 181 lb 14.1 oz (82.5 kg) (01/07 0628) Weight change: -1 lb 11.5 oz (-0.78 kg) Last BM Date: 09/20/13 Intake/Output from previous day: +218 (+1249 since admit) 01/06 0701 - 01/07 0700 In: 1018 [P.O.:1018] Out: 800 [Urine:800] Intake/Output this shift: Total I/O In: 10 [P.O.:10] Out: -   PE: General:Pleasant affect- slepy, NAD Skin:Warm and dry, brisk capillary refill HEENT:normocephalic, sclera clear, mucus membranes moist Heart:S1S2 RRR without murmur, gallup, rub or click Lungs:diminished without rales, rhonchi, or wheezes BJY:NWGNAbd:soft, non tender, + BS, do not palpate liver spleen or masses Ext:1-2+ lower ext edema- bil. Daughter states this is best he has been in sometime,  2+ radial pulses Neuro:alert and oriented, MAE, follows commands, + facial symmetry  TELE: a fib mostly in the 90s at times up into the 110s.    Lab Results:  Recent Labs  09/21/14 0410 09/22/14 0019  WBC 6.3 8.1  HGB 12.3* 11.9*  HCT 38.3* 36.1*  PLT 200 179   BMET  Recent Labs  09/21/14 0410 09/22/14 0019  NA 140 140  K 3.9 4.1  CL 99 101  CO2 35* 32  GLUCOSE 69* 106*  BUN 21 28*  CREATININE 1.44* 1.58*  CALCIUM 8.3* 8.1*    Recent Labs  09/21/14 2130 09/22/14 0019  TROPONINI 0.06* 0.06*    Hepatic Function Panel  Recent Labs  09/20/14 1417  PROT 6.0  ALBUMIN 2.9*  AST 25  ALT 12  ALKPHOS 78  BILITOT 0.6   No results for input(s): CHOL in the last 72 hours. No results for input(s): PROTIME in the last 72 hours.     Studies/Results: Dg Swallowing  Func-speech Pathology  09/22/2014   Ophelia ShoulderMelissa M Bowie, CCC-SLP     09/22/2014  4:32 PM Objective Swallowing Evaluation: Modified Barium Swallowing Study   Patient Details  Name: Erik Obeylmer Tung MRN: [id number] Date of Birth: September 21, 1913  Today's Date: 09/22/2014 Time: [id number] SLP Time Calculation (min) (ACUTE ONLY): 25 min  Past Medical History:  Past Medical History  Diagnosis Date  . Atrial fibrillation     rate controlled; asymptomatic  . Swelling of ankle   . Macular degeneration   . H/O spinal stenosis     R foot drop  . COPD, mild   . Chronic diastolic heart failure, NYHA class 2     With recent exacerbation - exacerbated by A. fib RVR  . Dysrhythmia     HX OF ATRIAL FIBRILATION  . CHF (congestive heart failure)   . Depression   . Pneumonia 09/2014   Past Surgical History:  Past Surgical History  Procedure Laterality Date  . Doppler echocardiography  May 2012    Normal EF greater than 55%. Moderate left and right atria  dilation; Mild MR; mild aortic sclerosis without stenosis    HPI:  Erik Obeylmer Mettler is a 81 y.o. male , resident of ALF, with history  of chronic diastolic CHF, permanent atrial fibrillation-too high  risk for anticoagulation given advanced age and fall risk, RBBB  with left anterior  fascicular block, bilateral leg edema,  hypotension secondary to medications, chronic respiratory failure  on home oxygen 2 L/m, presented from assisted living facility  with above symptoms. Patient unable to provide any history  secondary to altered mental status. History obtained from  patient's daughter/healthcare power of attorney Ms. Gayle at  bedside. Patient apparently has been gradually declining since  his hospitalization middle of 2015. Of late he's been mostly  wheelchair mobile and ambulates infrequently. A week ago, patient  had cough and was diagnosed with pneumonia based on chest x-ray  and was started on antibiotics/levofloxacin. 3 days back patient  appeared disoriented and sustained a fall in his room and was   noted to have oxygen saturations in the 80s off oxygen. Since  then patient has continued to have wet sounding cough but is  unable to bring up sputum, dyspnea, disorientation, excessive  sleepiness, poor oral intake, progressive weakness and ongoing  issues with hypoxia and his oxygen was increased to 3.5 L/m. Due  to worsening condition and lack of improvement, he presented to  the ED where he appears clinically dry, soft blood pressures,  creatinine 1.49, chest x-ray shows right lower lobe infiltrate or  atelectasis.      Assessment / Plan / Recommendation Clinical Impression  Dysphagia Diagnosis: Mild oral phase dysphagia;Mild pharyngeal  phase dysphagia Clinical impression: Patient presents with mild oral and  pharyngeal impairments that impact swallow function.  Soft solids  resulted in prolonged, but functional mastication.  Thin liquids  prematurely spilled to the level of the vallecula or pyriform  sinus depending on size of bolus and when patient was not  attending to bolus i.e. swishing liquids he would aspirate thin  liquids with delayed sensation.  Patient's strong cough was  effective at removing trace penetrates/aspirates.  Therefore  recommend to continue with current diet recommendations with full  supervision to ensure limited distractions, and small sips.  SLP  to follow up briefly for education with patient and family.     Treatment Recommendation  Therapy as outlined in treatment plan below    Diet Recommendation Dysphagia 3 (Mechanical Soft);Thin liquid   Liquid Administration via: Cup;Straw Medication Administration: Whole meds with puree Supervision: Full supervision/cueing for compensatory strategies Compensations: Slow rate;Small sips/bites Postural Changes and/or Swallow Maneuvers: Seated upright 90  degrees;Upright 30-60 min after meal    Other  Recommendations Oral Care Recommendations: Oral care BID   Follow Up Recommendations  Skilled Nursing facility;24 hour supervision/assistance     Frequency and Duration min 2x/week  2 weeks   Pertinent Vitals/Pain Patient lethargic during procedure    SLP Swallow Goals  See care plan    General HPI: Jas Betten is a 81 y.o. male , resident of ALF,  with history of chronic diastolic CHF, permanent atrial  fibrillation-too high risk for anticoagulation given advanced age  and fall risk, RBBB with left anterior fascicular block,  bilateral leg edema, hypotension secondary to medications,  chronic respiratory failure on home oxygen 2 L/m, presented from  assisted living facility with above symptoms. Patient unable to  provide any history secondary to altered mental status. History  obtained from patient's daughter/healthcare power of attorney Ms.  Gayle at bedside. Patient apparently has been gradually declining  since his hospitalization middle of 2015. Of late he's been  mostly wheelchair mobile and ambulates infrequently. A week ago,  patient had cough and was diagnosed with pneumonia based on chest  x-ray and was started on antibiotics/levofloxacin. 3 days back  patient appeared disoriented and sustained a fall in his room and  was noted to have oxygen saturations in the 80s off oxygen. Since  then patient has continued to have wet sounding cough but is  unable to bring up sputum, dyspnea, disorientation, excessive  sleepiness, poor oral intake, progressive weakness and ongoing  issues with hypoxia and his oxygen was increased to 3.5 L/m. Due  to worsening condition and lack of improvement, he presented to  the ED where he appears clinically dry, soft blood pressures,  creatinine 1.49, chest x-ray shows right lower lobe infiltrate or  atelectasis.  Type of Study: Modified Barium Swallowing Study Reason for Referral: Objectively evaluate swallowing function Previous Swallow Assessment: None Diet Prior to this Study: Dysphagia 3 (soft);Thin liquids Temperature Spikes Noted: No Respiratory Status: Nasal cannula History of Recent Intubation: No  Behavior/Cognition: Cooperative;Lethargic;Requires cueing Oral Cavity - Dentition: Dentures, top;Adequate natural dentition Oral Motor / Sensory Function: Within functional limits Self-Feeding Abilities: Needs assist;Able to feed self Patient Positioning: Upright in chair Baseline Vocal Quality: Clear Volitional Cough: Strong;Congested Volitional Swallow: Able to elicit Anatomy: Within functional limits Pharyngeal Secretions: Not observed secondary MBS    Reason for Referral Objectively evaluate swallowing function   Oral Phase Oral Preparation/Oral Phase Oral Phase: Impaired Oral - Solids Oral - Puree: Within functional limits Oral - Mechanical Soft: Impaired mastication;Other (Comment)  (prolonged but functional )   Pharyngeal Phase Pharyngeal Phase Pharyngeal Phase: Impaired Pharyngeal - Thin Pharyngeal - Thin Cup: Delayed swallow initiation;Premature  spillage to valleculae;Premature spillage to pyriform  sinuses;Penetration/Aspiration before swallow;Trace aspiration Penetration/Aspiration details (thin cup): Material enters  airway, passes BELOW cords then ejected out Pharyngeal - Thin Straw: Within functional limits Pharyngeal Phase - Comment Pharyngeal Comment: attention and lethergy appered to impact  function  Cervical Esophageal Phase    GO   Fae Pippin, M.A., CCC-SLP [phone]  Cervical Esophageal Phase Cervical Esophageal Phase: [facility]         BOWIE,MELISSA 09/22/2014, 4:31 PM     Medications: I have reviewed the patient's current medications. Scheduled Meds: . antiseptic oral rinse  7 mL Mouth Rinse BID  . clopidogrel  75 mg Oral Daily  . enoxaparin (LOVENOX) injection  30 mg Subcutaneous Q24H  . furosemide  40 mg Oral Daily  . lactobacillus acidophilus  1 tablet Oral BID  . lactose free nutrition  237 mL Oral TID WC  . levothyroxine  75 mcg Oral QAC breakfast  . loratadine  10 mg Oral Daily  . metoprolol tartrate  12.5 mg Oral BID  . piperacillin-tazobactam (ZOSYN)  IV  3.375 g Intravenous  Q8H  . vancomycin  1,000 mg Intravenous Q24H   Continuous Infusions:  PRN Meds:.acetaminophen, albuterol, guaifenesin, ondansetron (ZOFRAN) IV  Assessment/Plan:  81 year old Caucasian male with history of permanent atrial fibrillation, COPD, chronic multifactorial dyspnea on 24 hrs 2 L home oxygen, chronic diastolic heart failure and hypothyroidism present with AMS, now improving.    1 Mental status changes MS is improved some today rom yesterday    2. Dyspnea  I would hold lasix today with bump in BNP    3. Atrial fib Rates a little high on current regimen  IBP is marginal  Not an anticoag candidate   4. Hypotension BP still 90s to 100s continues to be soft continue to follow   5.  Presumed Healthcare associated pneumonia on Vancomycin and Zosyn followed by IM  6. Chronic kidney dz  Hold lasix today    7. Urinary retention- per IM  LOS: 3 days   Time spent with pt. :15 minutes. [facility] R  Nurse Practitioner Certified Pager [phone] or after 5pm and on weekends call [phone] 09/23/2014, 8:31 AM  Patinet seen / examined  I have amended note above to reflect my findings. Patient's medical status is fragile  He is no longer on , nor could he tolerate , meds he had prior to admission.   Spoke to daughter.  Informed her that balancing of his medical issues could be difficult in future.    Will continue to follow.   Dietrich Pates

## 2014-09-23 NOTE — Progress Notes (Signed)
Patient Jeremiah Mcmahon      DOB: [birth year]      YNW:[id number]   [facility]    Subjective: No pain, dyspnea, N/V. Appetite poor.  Wants to try rehab. Contemplating going home with daughter afterwards    Filed Vitals:   09/23/14 1036  BP: 104/69  Pulse: 87  Temp:   Resp:    Physical exam: GEN: alert, NAD HEENT: [place], sclera anicteric CV: irregular LUNGS: CTAB ABD: soft, ND EXT: warm  CBC    Component Value Date/Time   WBC 7.0 09/23/2014 0826   RBC 3.41* 09/23/2014 0826   HGB 11.3* 09/23/2014 0826   HCT 35.4* 09/23/2014 0826   PLT 171 09/23/2014 0826   MCV 103.8* 09/23/2014 0826   MCH 33.1 09/23/2014 0826   MCHC 31.9 09/23/2014 0826   RDW 13.9 09/23/2014 0826   LYMPHSABS 0.6* 09/20/2014 1417   MONOABS 0.6 09/20/2014 1417   EOSABS 0.2 09/20/2014 1417   BASOSABS 0.0 09/20/2014 1417    CMP     Component Value Date/Time   NA 141 09/23/2014 0826   K 4.1 09/23/2014 0826   CL 99 09/23/2014 0826   CO2 36* 09/23/2014 0826   GLUCOSE 100* 09/23/2014 0826   BUN 25* 09/23/2014 0826   CREATININE 1.62* 09/23/2014 0826   CALCIUM 8.3* 09/23/2014 0826   PROT 6.0 09/20/2014 1417   ALBUMIN 2.9* 09/20/2014 1417   AST 25 09/20/2014 1417   ALT 12 09/20/2014 1417   ALKPHOS 78 09/20/2014 1417   BILITOT 0.6 09/20/2014 1417   GFRNONAA 34* 09/23/2014 0826   GFRAA 39* 09/23/2014 0826       Assessment and plan: 81 yo male with PMHx of Afib, diastolic HF, COPD on chronic O2 who presented with PNA.   1. Code Status: DNR   2. Goals of Care:  See initial consult. Spoke again with daughter and Jay Schlichterlmer today.  Estaban continues to express that he is frustrated by loss of independence and not being able to do the things he used to enjoy doing.  Denies feeling depressed, just more difficulty doing things he enjoys.  Feels ready for when death comes and not fearful of this.  In speaking with them today, there goal is to get Cooper to rehab to  see if he can gain some strength back after this acute illness. From there they are discussing whether he will return to ALF or home with daughter. At that time, they are intersted in involving hospice care (would not be able to do rehab and hospice at same time).  I would recommend social work/case management arrange for outpatient palliative care services to engage them while at rehab to help make this transition smoothly.   3. Symptom Management:  1. Loss of Appetite- Will go ahead and start low dose remeron 2. Acute Encephalopathy- likely related to infection. Would avoid benzo's. Try to ensure good sleep/wake cycles. Improving  4. Psychosocial/Spiritual: Lives at ALF. Has 2 daughters. 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 Enjoys travel, reading, art. Frustrated by loss of independence.   Total Time: 25 minutes >50% of time spent in counseling and coordination of care regarding above.   Orvis BrillAaron J. Arval Brandstetter D.O. [facility]  Pager: [phone] Team Phone: [phone]

## 2014-09-24 LAB — CBC
HEMATOCRIT: 35.3 % — AB (ref 39.0–52.0)
HEMOGLOBIN: 11.4 g/dL — AB (ref 13.0–17.0)
MCH: 34.1 pg — AB (ref 26.0–34.0)
MCHC: 32.3 g/dL (ref 30.0–36.0)
MCV: 105.7 fL — AB (ref 78.0–100.0)
PLATELETS: 158 10*3/uL (ref 150–400)
RBC: 3.34 MIL/uL — ABNORMAL LOW (ref 4.22–5.81)
RDW: 13.9 % (ref 11.5–15.5)
WBC: 7.4 10*3/uL (ref 4.0–10.5)

## 2014-09-24 LAB — BASIC METABOLIC PANEL
Anion gap: 6 (ref 5–15)
BUN: 23 mg/dL (ref 6–23)
CHLORIDE: 98 meq/L (ref 96–112)
CO2: 35 mmol/L — ABNORMAL HIGH (ref 19–32)
Calcium: 8.3 mg/dL — ABNORMAL LOW (ref 8.4–10.5)
Creatinine, Ser: 1.45 mg/dL — ABNORMAL HIGH (ref 0.50–1.35)
GFR calc Af Amer: 45 mL/min — ABNORMAL LOW (ref >90)
GFR, EST NON AFRICAN AMERICAN: 39 mL/min — AB (ref >90)
GLUCOSE: 95 mg/dL (ref 70–99)
Potassium: 4.1 mmol/L (ref 3.5–5.1)
Sodium: 139 mmol/L (ref 135–145)

## 2014-09-24 LAB — GLUCOSE, CAPILLARY: Glucose-Capillary: 114 mg/dL — ABNORMAL HIGH (ref 70–99)

## 2014-09-24 MED ORDER — METOPROLOL TARTRATE 25 MG PO TABS
25.0000 mg | ORAL_TABLET | Freq: Two times a day (BID) | ORAL | Status: DC
  Filled 2014-09-24: qty 1

## 2014-09-24 MED ORDER — MIRTAZAPINE 7.5 MG PO TABS: 7.5000 mg | ORAL_TABLET | Freq: Every day | ORAL | Status: AC

## 2014-09-24 MED ORDER — TAMSULOSIN HCL 0.4 MG PO CAPS: 0.4000 mg | ORAL_CAPSULE | Freq: Every day | ORAL | Status: AC

## 2014-09-24 MED ORDER — TORSEMIDE 20 MG PO TABS: 20.0000 mg | ORAL_TABLET | Freq: Every day | ORAL | Status: AC

## 2014-09-24 MED ORDER — BOOST PLUS PO LIQD: 237.0000 mL | Freq: Three times a day (TID) | ORAL | Status: AC

## 2014-09-24 MED ORDER — AMOXICILLIN-POT CLAVULANATE 875-125 MG PO TABS: 1.0000 | ORAL_TABLET | Freq: Two times a day (BID) | ORAL | Status: AC

## 2014-09-24 MED ORDER — METOPROLOL TARTRATE 25 MG PO TABS: 12.5000 mg | ORAL_TABLET | Freq: Two times a day (BID) | ORAL | Status: AC

## 2014-09-24 MED ORDER — FUROSEMIDE 40 MG PO TABS
40.0000 mg | ORAL_TABLET | Freq: Every day | ORAL | Status: DC
  Administered 2014-09-24: 40 mg via ORAL
  Filled 2014-09-24: qty 1

## 2014-09-24 NOTE — Clinical Social Work Note (Signed)
Clinical Social Worker facilitated patient discharge including contacting patient family and facility to confirm patient discharge plans.  Clinical information faxed to facility and family agreeable with plan.  CSW arranged ambulance transport via [facility] to [facility].  RN to call report prior to discharge.  Clinical Social Worker will sign off for now as social work intervention is no longer needed. Please consult us again if new need arises.  Jesse Whittaker Lenis, LCSW [phone] 

## 2014-09-24 NOTE — Progress Notes (Signed)
[facility] [place] [facility]

## 2014-09-24 NOTE — Discharge Summary (Signed)
Physician Discharge Summary  Patient ID: Jeremiah Mcmahon MRN: [id number] DOB/AGE: 1914/07/20 81 y.o.  Admit date: 09/20/2014 Discharge date: 09/24/2014  Primary Care Physician:  Darlina Guys, MD  Discharge Diagnoses:    . HCAP (healthcare-associated pneumonia)/ aspiration pneumonia  . Acute encephalopathy . Acute on chronic respiratory failure with hypoxia . Permanent atrial fibrillation . COPD (chronic obstructive pulmonary disease) . RBBB with left anterior fascicular block . Hypothyroid . Chronic diastolic heart failure, NYHA class 2; status post recent exacerbation in May 2015 . Failure to thrive in adult . Acute kidney injury . Do not resuscitate  Consults: Cardiology, Dr. Tenny Craw  Palliative medicine, Dr Carlota Raspberry   Recommendations for Outpatient Follow-up:  Patient needs foley catheter for now, recommend voiding trial on Monday, 1/11. He will benefit from urology referral for outpatient follow-up.  Diet Mechanical soft, thin liquids Medications: Whole meds in pure Full supervision and cueing for compensatory strategies Slow rate with small sips and bites Postural changes and swallow maneuvers: Seated upright 90 upright, 30-60 minutes after the meal  Oral care BID  Chronic diastolic CHF - Demadex 20 mg daily, check BMET on Monday 09/27/14 and weight. Hold if SBP less than 100, creatinine function wrosening Needs follow-up appointment with his cardiologist, Dr. Herbie Baltimore  Palliative to follow at the facility.  TESTS THAT NEED FOLLOW-UP BMET on Monday 09/27/14     Allergies:  No Known Allergies   Discharge Medications:   Medication List    STOP taking these medications        diltiazem 60 MG tablet  Commonly known as:  CARDIZEM     levofloxacin 750 MG tablet  Commonly known as:  LEVAQUIN     spironolactone 25 MG tablet  Commonly known as:  ALDACTONE      TAKE these medications        amoxicillin-clavulanate 875-125 MG per tablet  Commonly known as:   AUGMENTIN  Take 1 tablet by mouth 2 (two) times daily. X 10days     clopidogrel 75 MG tablet  Commonly known as:  PLAVIX  TAKE ONE TABLET BY MOUTH EVERY DAY     guaifenesin 100 MG/5ML syrup  Commonly known as:  ROBITUSSIN  Take 200 mg by mouth 4 (four) times daily as needed for cough.     lactobacillus acidophilus Tabs tablet  Take 1 tablet by mouth 2 (two) times daily.     lactose free nutrition Liqd  Take 237 mLs by mouth 3 (three) times daily with meals.     levothyroxine 75 MCG tablet  Commonly known as:  SYNTHROID, LEVOTHROID  Take 75 mcg by mouth daily before breakfast.     loratadine 10 MG tablet  Commonly known as:  CLARITIN  Take 10 mg by mouth daily.     metoprolol tartrate 25 MG tablet  Commonly known as:  LOPRESSOR  Take 0.5 tablets (12.5 mg total) by mouth 2 (two) times daily.     mirtazapine 7.5 MG tablet  Commonly known as:  REMERON  Take 1 tablet (7.5 mg total) by mouth at bedtime.     OXYGEN  Inhale into the lungs. 2 LITERS     tamsulosin 0.4 MG Caps capsule  Commonly known as:  FLOMAX  Take 1 capsule (0.4 mg total) by mouth daily.     torsemide 20 MG tablet  Commonly known as:  DEMADEX  Take 1 tablet (20 mg total) by mouth daily.  Start taking on:  09/25/2014  Brief H and P: For complete details please refer to admission H and P, but in brief patient is a 81 year old male, resident of ALS, chronic diastolic CHF, permanent A. fib, high risk clinic decannulation given advanced age and fall risk, right BBB with left anterior fascicular block, bilateral leg edema, hypotension and scabby to medications, chronic respiratory failure on home O2 2 L presented from the facility with altered mental status, hypoxia, cough and dyspnea. Per daughter at the bedside, patient had been gradually declining since his last hospitalization in middle of 2015. Patient has been of late mostly in wheelchair, ambulates infrequently. A week ago, patient had a cough and  was diagnosed with pneumonia based on chest x-ray and was started on levofloxacin. 3 days prior to admission, he was disoriented, sustained a fall in his room and was noted to have hypoxia with O2 sats 80%. Since then patient continued to have a wet sounding cough, unable to bring up sputum, dyspnea, disorientation and excessive sleepiness, poor by mouth intake. Due to worsening condition and lack of improvement patient presented to ED for further workup. Chest x-ray showed right lower lobe infiltrates or atelectasis.  Hospital Course:  Healthcare associated pneumonia: Likely aspiration -Most likely aspiration, patient was seen by speech therapy, underwent modified barium study with recommendations for dysphagia 3 diet with thin liquids, and aspiration precautions as ALF, where he resides. He was recently on Levaquin for PNA in outpatient setting. He remains afebrile, no leukocytosis- lungs with good air movement bilaterally. On 2 L [place] (chronically on 2 L ). Patient was placed on IV vancomycin and Zosyn, today day for #5, he was transitioned to oral Augmentin to continue full course. Continue mucolytic, O2 via nasal cannula. Patient will need full supervision with the diet with aspiration precautions as listed above.   Urinary retention  patient had an episode of urinary retention on 1/7. He was placed on Flomax, Foley catheter was placed. Please keep the Foley catheter, voiding trial on Monday 09/27/13. He will benefit from urology referral and outpatient follow-up. UA negative for UTI.  Dehydration, hypotension Improving, patient has history of chronic hypotension, likely worsened by poor by mouth intake. Patient did need IV fluid hydration initially. However subsequently held due to concern of fluid overload. Patient had a mild bump of creatinine to 1.62 with starting Lasix. He usually takes Demadex 20 mg BID and Aldactone. Will recommend Demadex 20 mg daily and follow up with Dr. Herbie Baltimore  outpatient.   Mild acute on chronic Diastolic CHF -+1 LE edema, lungs without crackles, 2D echo-EF 55-60, limited study. IV fluids were discontinued, patient was restarted on Lasix. Currently held due to mild creatinine bump to 1.6. Patient usually takes Demadex 20 mg twice a day and spironolactone, will place on the on Demadex 20 mg daily for now. He needs to follow up with his primary cardiologist, Dr. Herbie Baltimore.   Acute kidney injury -Secondary to dehydration- unknown if has CKD. Creatinine improving to baseline, 1.4   Acute encephalopathy Improving, patient has underlying dementia also. Palliative medicine was consulted for goals of care, recommended Remeron. He would strongly benefit from palliative medicine following at the facility. PTOT evaluation recommended skilled nursing facility, the patient's daughter was keen on taking patient back to [facility] where he is familiar with his surroundings and environment.  Atrial fibrillation with RVR -Rate controlled. -09/21/13- with episode of afib with RVR, due to soft BP, cardiazem was held. Patient was started on low-dose Lopressor by cardiology. Not on anticoagulation  due to advanced age/risk of fall, Continue Plavix.  Acute on chronic hypoxic respiratory failure -Stable, on home regimen of 2L [place]  Hypothyroidism -Continue Synthroid 75 MCG daily    Day of Discharge BP 107/66 mmHg  Pulse 98  Temp(Src) 98.4 F (36.9 C) (Oral)  Resp 18  Ht 5\' 9"  (1.753 m)  Wt 83 kg (182 lb 15.7 oz)  BMI 27.01 kg/m2  SpO2 97%  Physical Exam: General: Alert and awakeoriented to self, NAD  CVS: S1-S2 clear no murmur rubs or gallops Chest:Decreased breath sounds at the bases  Abdomen: soft nontender, nondistended, normal bowel sounds Extremities: no cyanosis, clubbing,1+ lower extremities edema  Neuro:: Cranial nerves II-XII intact, no focal neurological deficits   The results of significant diagnostics from this hospitalization (including  imaging, microbiology, ancillary and laboratory) are listed below for reference.    LAB RESULTS: Basic Metabolic Panel:  Recent Labs Lab 09/23/14 0826 09/24/14 0526  NA 141 139  K 4.1 4.1  CL 99 98  CO2 36* 35*  GLUCOSE 100* 95  BUN 25* 23  CREATININE 1.62* 1.45*  CALCIUM 8.3* 8.3*   Liver Function Tests:  Recent Labs Lab 09/20/14 1417  AST 25  ALT 12  ALKPHOS 78  BILITOT 0.6  PROT 6.0  ALBUMIN 2.9*   No results for input(s): LIPASE, AMYLASE in the last 168 hours. No results for input(s): AMMONIA in the last 168 hours. CBC:  Recent Labs Lab 09/20/14 1417  09/23/14 0826 09/24/14 0526  WBC 5.7  < > 7.0 7.4  NEUTROABS 4.3  --   --   --   HGB 12.0*  < > 11.3* 11.4*  HCT 37.4*  < > 35.4* 35.3*  MCV 103.9*  < > 103.8* 105.7*  PLT 184  < > 171 158  < > = values in this interval not displayed. Cardiac Enzymes:  Recent Labs Lab 09/21/14 2130 09/22/14 0019  CKTOTAL 142 138  CKMB 2.2 2.2  TROPONINI 0.06* 0.06*   BNP: Invalid input(s): POCBNP CBG: No results for input(s): GLUCAP in the last 168 hours.  Significant Diagnostic Studies:  Dg Chest 2 View  09/20/2014   CLINICAL DATA:  Per EMS, pt coming from [facility] for increased lethargy x3 days. Pt was recently diagnosed with pneumonia and has 3 days of his antibiotics left. Pt is always on O2 3 L [place] at home. Pt has hx of A-fib, COPD, chronic diastolic heart failure. Past-smoker.  EXAM: CHEST  2 VIEW  COMPARISON:  10/20/2013  FINDINGS: The heart is enlarged. There is dense opacity at the right lung base which obscures the hemidiaphragm. Right pleural effusion is present. Small left pleural effusion is present. No pulmonary edema.  IMPRESSION: 1. Cardiomegaly. 2. Right lower lobe infiltrate or atelectasis. 3. Bilateral pleural effusions, right greater than left.   Electronically Signed   By: Rosalie GumsBeth  Brown M.D.   On: 09/20/2014 15:14       Disposition and Follow-up: Discharge Instructions     Discharge instructions    Complete by:  As directed   DIET: Mechanical soft, thin liquids. Full supervision with meals, sit upright. MEDS WHOLE IN PUREE     Increase activity slowly    Complete by:  As directed             DISPOSITION: *ALF  DISCHARGE FOLLOW-UP Follow-up Information    Follow up with Darlina GuysPOWELL, JERRY, MD. Schedule an appointment as soon as possible for a visit in 10 days.   Specialty:  Internal Medicine   Why:  for hospital follow-up   Contact information:   [address] [place] [address] [phone]       Follow up with Jethro Bolus I, MD. Schedule an appointment as soon as possible for a visit in 10 days.   Specialty:  Urology   Why:  FOR UROLOGY follow-up    Contact information:   [address] [place] [address] [phone]       Follow up with HARDING, DAVID W, MD. Schedule an appointment as soon as possible for a visit in 2 weeks.   Specialty:  Cardiology   Why:  for hospital follow-up   Contact information:   [address] [place] [address] [phone]        Time spent on Discharge: 35 mins  Signed:   Adama Ferber M.D. [facility] 09/24/2014, 11:02 AM Pager: [phone]

## 2014-09-24 NOTE — Care Management Note (Signed)
    Page 1 of 1   09/24/2014     11:19:57 AM CARE MANAGEMENT NOTE 09/24/2014  Patient:  Jeremiah Mcmahon,Jeremiah Mcmahon   Account Number:  [id number]  Date Initiated:  09/24/2014  Documentation initiated by:  Letha CapeAYLOR,Sharalee Witman  Subjective/Objective Assessment:   dx hcap, afib with rvr  admit- from [facility]     Action/Plan:   pt eval rec snf   Anticipated DC Date:  09/24/2014   Anticipated DC Plan:  SKILLED NURSING FACILITY  In-house referral  Clinical Social Worker      DC Planning Services  CM consult      Choice offered to / List presented to:             Status of service:  Completed, signed off Medicare Important Message given?  YES (If response is "NO", the following Medicare IM given date fields will be blank) Date Medicare IM given:  09/22/2014 Medicare IM given by:  Letha CapeAYLOR,Tyson Parkison Date Additional Medicare IM given:   Additional Medicare IM given by:    Discharge Disposition:  SKILLED NURSING FACILITY  Per UR Regulation:  Reviewed for med. necessity/level of care/duration of stay  If discussed at Long Length of Stay Meetings, dates discussed:    Comments:  09/24/14 1117 Letha Capeeborah Crystalmarie Yasin RN, BSN [phone]  patient is for dc [facility] today, CSW following.

## 2014-09-24 NOTE — Progress Notes (Addendum)
Jeremiah Mcmahon to be D/C'd Skilled nursing facility per MD order.  Discussed with the patient and all questions fully answered.    Medication List    STOP taking these medications        diltiazem 60 MG tablet  Commonly known as:  CARDIZEM     levofloxacin 750 MG tablet  Commonly known as:  LEVAQUIN     spironolactone 25 MG tablet  Commonly known as:  ALDACTONE      TAKE these medications        amoxicillin-clavulanate 875-125 MG per tablet  Commonly known as:  AUGMENTIN  Take 1 tablet by mouth 2 (two) times daily. X 10days     clopidogrel 75 MG tablet  Commonly known as:  PLAVIX  TAKE ONE TABLET BY MOUTH EVERY DAY     guaifenesin 100 MG/5ML syrup  Commonly known as:  ROBITUSSIN  Take 200 mg by mouth 4 (four) times daily as needed for cough.     lactobacillus acidophilus Tabs tablet  Take 1 tablet by mouth 2 (two) times daily.     lactose free nutrition Liqd  Take 237 mLs by mouth 3 (three) times daily with meals.     levothyroxine 75 MCG tablet  Commonly known as:  SYNTHROID, LEVOTHROID  Take 75 mcg by mouth daily before breakfast.     loratadine 10 MG tablet  Commonly known as:  CLARITIN  Take 10 mg by mouth daily.     metoprolol tartrate 25 MG tablet  Commonly known as:  LOPRESSOR  Take 0.5 tablets (12.5 mg total) by mouth 2 (two) times daily.     mirtazapine 7.5 MG tablet  Commonly known as:  REMERON  Take 1 tablet (7.5 mg total) by mouth at bedtime.     OXYGEN  Inhale into the lungs. 2 LITERS     tamsulosin 0.4 MG Caps capsule  Commonly known as:  FLOMAX  Take 1 capsule (0.4 mg total) by mouth daily.     torsemide 20 MG tablet  Commonly known as:  DEMADEX  Take 1 tablet (20 mg total) by mouth daily.  Start taking on:  09/25/2014        VVS. Pt drowsy at baseline.  IV catheter discontinued intact. Site without signs and symptoms of complications. Dressing and pressure applied.  Pt [facility] to [facility]  [place]s[facility], Thornell MuleRachel C 09/24/2014  1:10 PM

## 2014-09-24 NOTE — Plan of Care (Signed)
Problem: Phase III Progression Outcomes Goal: Foley discontinued Outcome: Adequate for Discharge Dr. Isidoro Donningai verbal order to keep foley in after discharge

## 2014-09-24 NOTE — Progress Notes (Addendum)
  Jeremiah PatesPaula Mcmahon   Subjective: Sleeping comfortably   Objective: Filed Vitals:   09/23/14 1328 09/24/14 0554 09/24/14 0951 09/24/14 0952  BP: 104/67 106/63 107/66   Pulse: 91 108  98  Temp: 98 F (36.7 C) 98.4 F (36.9 C)    TempSrc: Oral Oral    Resp: 24 18    Height:      Weight:  182 lb 15.7 oz (83 kg)    SpO2: 97% 97%     Weight change: 1 lb 1.6 oz (0.5 kg)  Intake/Output Summary (Last 24 hours) at 09/24/14 1133 Last data filed at 09/24/14 0952  Gross per 24 hour  Intake    608 ml  Output    700 ml  Net    -92 ml    General: Patient sleeping   Heart: Regular rate and rhythm, without murmurs, rubs, gallops.  Lungs  Rhonchi on L  Decreased BS RLL Exemities:  Tr  edema.    Tele:  Afib  90s to 100s  .   Lab Results: Results for orders placed or performed during the hospital encounter of 09/20/14 (from the past 24 hour(s))  Basic metabolic panel     Status: Abnormal   Collection Time: 09/24/14  5:26 AM  Result Value Ref Range   Sodium 139 135 - 145 mmol/L   Potassium 4.1 3.5 - 5.1 mmol/L   Chloride 98 96 - 112 mEq/L   CO2 35 (H) 19 - 32 mmol/L   Glucose, Bld 95 70 - 99 mg/dL   BUN 23 6 - 23 mg/dL   Creatinine, Ser 1.611.45 (H) 0.50 - 1.35 mg/dL   Calcium 8.3 (L) 8.4 - 10.5 mg/dL   GFR calc non Af Amer 39 (L) >90 mL/min   GFR calc Af Amer 45 (L) >90 mL/min   Anion gap 6 5 - 15  CBC     Status: Abnormal   Collection Time: 09/24/14  5:26 AM  Result Value Ref Range   WBC 7.4 4.0 - 10.5 K/uL   RBC 3.34 (L) 4.22 - 5.81 MIL/uL   Hemoglobin 11.4 (L) 13.0 - 17.0 g/dL   HCT 09.635.3 (L) 04.539.0 - 40.952.0 %   MCV 105.7 (H) 78.0 - 100.0 fL   MCH 34.1 (H) 26.0 - 34.0 pg   MCHC 32.3 30.0 - 36.0 g/dL   RDW 81.113.9 91.411.5 - 78.215.5 %   Platelets 158 150 - 400 K/uL    Studies/Results: No results found.  Medications: Reviewed     @PROBHOSP @  1  Afib  Will increase lopressor to 25 bid  Follow HR and BP  2.  Chronic diastolic CHF  Will add lasix 40 qd to regimen  Labs will need to be  followed  He was on torsemide 20 bid prior to admit  Too much      LOS: 4 days   Jeremiah Mcmahon 09/24/2014, 11:33 AM

## 2014-09-25 ENCOUNTER — Telehealth: Payer: Self-pay | Admitting: Physician Assistant

## 2014-09-25 NOTE — Telephone Encounter (Signed)
     Patient's wife called after hours services to talk about her husbands [facility]. She wanted to make sure Dr. Tenny Crawoss thought she he should have been discharged or if she thought it was too early. She also wanted to know if he should go back on IV abx for PNA. The patient is clinically improving and doing quite well. I reassured her that the IM doctors are very competent and the timing of his discharge was probably very safe and their decision to switch him to PO abx clinically appropriate. She thanked me and felt better.    Cline CrockKathryn Thompson PA-C  [facility]

## 2014-09-27 ENCOUNTER — Telehealth: Payer: Self-pay | Admitting: Cardiology

## 2014-09-27 LAB — CULTURE, BLOOD (ROUTINE X 2)
Culture: NO GROWTH
Culture: NO GROWTH

## 2014-09-28 NOTE — Telephone Encounter (Signed)
Close encounter 

## 2014-09-30 ENCOUNTER — Ambulatory Visit: Payer: Medicare Other | Admitting: Nurse Practitioner

## 2014-10-05 ENCOUNTER — Ambulatory Visit: Payer: Medicare Other | Admitting: Physician Assistant

## 2014-10-18 DEATH — deceased

## 2014-12-02 ENCOUNTER — Ambulatory Visit: Payer: Medicare Other | Admitting: Cardiology
